# Patient Record
Sex: Male | Born: 1995 | Race: White | Hispanic: No | Marital: Single | Smoking: Former smoker
Health system: Southern US, Community
[De-identification: ages and names within clinical notes are randomized; demographics above are authoritative.]

## PROBLEM LIST (undated history)

## (undated) DIAGNOSIS — F32A Depression, unspecified: Secondary | ICD-10-CM

## (undated) DIAGNOSIS — F329 Major depressive disorder, single episode, unspecified: Secondary | ICD-10-CM

## (undated) DIAGNOSIS — F419 Anxiety disorder, unspecified: Secondary | ICD-10-CM

## (undated) DIAGNOSIS — J45909 Unspecified asthma, uncomplicated: Secondary | ICD-10-CM

---
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
## 2016-04-11 ENCOUNTER — Emergency Department
Admission: EM | Admit: 2016-04-11 | Discharge: 2016-04-11 | Disposition: A | Discharge status: Home / Self Care | Payer: Self-pay | Attending: Emergency Medicine | Admitting: Emergency Medicine

## 2016-04-11 ENCOUNTER — Encounter: Payer: Self-pay | Admitting: *Deleted

## 2016-04-11 DIAGNOSIS — R519 Headache, unspecified: Secondary | ICD-10-CM

## 2016-04-11 DIAGNOSIS — F17210 Nicotine dependence, cigarettes, uncomplicated: Secondary | ICD-10-CM | POA: Insufficient documentation

## 2016-04-11 DIAGNOSIS — R51 Headache: Secondary | ICD-10-CM | POA: Insufficient documentation

## 2016-04-11 MED ORDER — PROCHLORPERAZINE MALEATE 10 MG PO TABS: 10.0000 mg | ORAL_TABLET | Freq: Three times a day (TID) | ORAL | 0 refills | Status: AC | PRN

## 2016-04-11 MED ORDER — PROCHLORPERAZINE EDISYLATE 5 MG/ML IJ SOLN
INTRAMUSCULAR | Status: AC
  Filled 2016-04-11: qty 2

## 2016-04-11 MED ORDER — PROCHLORPERAZINE EDISYLATE 5 MG/ML IJ SOLN
10.0000 mg | Freq: Once | INTRAMUSCULAR | Status: AC
  Administered 2016-04-11: 10 mg via INTRAVENOUS
  Filled 2016-04-11: qty 2

## 2016-04-11 MED ORDER — SODIUM CHLORIDE 0.9 % IV BOLUS (SEPSIS)
1000.0000 mL | Freq: Once | INTRAVENOUS | Status: AC
  Administered 2016-04-11: 1000 mL via INTRAVENOUS

## 2016-04-11 NOTE — ED Notes (Signed)
Pt reports left sided headache pain  "i have never hurt like this - it has been hurting ever since I got up today - I ate lunch and my head has just been pounding all afternoon."   Pt crying and holding his head during assessment

## 2016-04-11 NOTE — Discharge Instructions (Signed)
Please seek medical attention for any high fevers, chest pain, shortness of breath, change in behavior, persistent vomiting, bloody stool or any other new or concerning symptoms.  

## 2016-04-11 NOTE — ED Provider Notes (Signed)
[facility] Emergency Department Provider Note   ____________________________________________   I have reviewed the triage vital signs and the nursing notes.   HISTORY  Chief Complaint Headache   History limited by: Not Limited   HPI DEJION GRILLO is a 20 y.o. male  Who presents to the emergency department today because of concerns for headache. He is located in the left side of his head. Started this morning after the patient woke up. Initially was intermittent back has become more constant. It has become severe. He has not noticed any blurred vision or double vision. He has not had any vomiting.He denies si headaches in the past. No recent illnesses or fevers. No recent trauma to the head.     History reviewed. No pertinent past medical history.  There are no active problems to display for this patient.   History reviewed. No pertinent surgical history.  Prior to Admission medications   Medication Sig Start Date End Date Taking? Authorizing Provider  cyclobenzaprine (FLEXERIL) 10 MG tablet Take 1 tablet (10 mg total) by mouth every 8 (eight) hours as needed for muscle spasms. 09/22/15   Joni Reining, PA-C  ibuprofen (ADVIL,MOTRIN) 600 MG tablet Take 1 tablet (600 mg total) by mouth every 6 (six) hours as needed. 09/22/15   Joni Reining, PA-C  traMADol (ULTRAM) 50 MG tablet Take 1 tablet (50 mg total) by mouth every 6 (six) hours as needed for moderate pain. 09/22/15   Joni Reining, PA-C    Allergies Review of patient's allergies indicates no known allergies.  History reviewed. No pertinent family history.  Social History Social History  Substance Use Topics  . Smoking status: Current Every Day Smoker    Packs/day: 0.50    Types: Cigarettes  . Smokeless tobacco: Never Used  . Alcohol use No    Review of Systems  Constitutional: Negative for fever. Cardiovascular: Negative for chest pain. Respiratory: Negative for shortness of  breath. Gastrointestinal: Negative for abdominal pain, vomiting and diarrhea. Genitourinary: Negative for dysuria. Musculoskeletal: Negative for back pain. Skin: Negative for rash. Neurological: Positive for headache.  10-point ROS otherwise negative.  ____________________________________________   PHYSICAL EXAM:  VITAL SIGNS: ED Triage Vitals  Enc Vitals Group     BP 04/11/16 1634 134/64     Pulse Rate 04/11/16 1634 71     Resp 04/11/16 1634 18     Temp 04/11/16 1634 97.5 F (36.4 C)     Temp Source 04/11/16 1634 Oral     SpO2 04/11/16 1634 100 %     Weight 04/11/16 1634 230 lb (104.3 kg)     Height 04/11/16 1634 6' (1.829 m)     Head Circumference --      Peak Flow --      Pain Score 04/11/16 1635 10   Constitutional: Alert and oriented. Well appearing and in no distress. Eyes: Conjunctivae are normal. Normal extraocular movements. ENT   Head: Normocephalic and atraumatic.   Nose: No congestion/rhinnorhea.   Mouth/Throat: Mucous membranes are moist.   Neck: No stridor. Hematological/Lymphatic/Immunilogical: No cervical lymphadenopathy. Cardiovascular: Normal rate, regular rhythm.  No murmurs, rubs, or gallops. Respiratory: Normal respiratory effort without tachypnea nor retractions. Breath sounds are clear and equal bilaterally. No wheezes/rales/rhonchi. Gastrointestinal: Soft and nontender. No distention.  Genitourinary: Deferred Musculoskeletal: Normal range of motion in all extremities. No lower extremity edema. Neurologic:  Normal speech and language. No gross focal neurologic deficits are appreciated.  Skin:  Skin is warm, dry  and intact. No rash noted. Psychiatric: Mood and affect are normal. Speech and behavior are normal. Patient exhibits appropriate insight and judgment.  ____________________________________________    LABS (pertinent  positives/negatives)  None  ____________________________________________   EKG  None  ____________________________________________    RADIOLOGY  None  ____________________________________________   PROCEDURES  Procedures  ____________________________________________   INITIAL IMPRESSION / ASSESSMENT AND PLAN / ED COURSE  Pertinent labs & imaging results that were available during my care of the patient were reviewed by me and considered in my medical decision making (see chart for details).  Patient presented to the emergency department today because of concerns for headache. No trauma. The patient was given IV fluids and stated he felt much better. Will discharge home with prescription. ____________________________________________   FINAL CLINICAL IMPRESSION(S) / ED DIAGNOSES  Final diagnoses:  Headache, unspecified headache type     Note: This dictation was prepared with Dragon dictation. Any transcriptional errors that result from this process are unintentional    Phineas SemenGraydon Lazariah Savard, MD 04/11/16 1943

## 2016-04-11 NOTE — ED Triage Notes (Signed)
States severe headache that began this AM, states he recently had a cold, pt states no relief with OTC meds, denies any hx of migranes

## 2017-03-13 ENCOUNTER — Encounter: Payer: Self-pay | Admitting: Medical Oncology

## 2017-03-13 ENCOUNTER — Emergency Department: Payer: Self-pay

## 2017-03-13 ENCOUNTER — Emergency Department
Admission: EM | Admit: 2017-03-13 | Discharge: 2017-03-13 | Disposition: A | Discharge status: Home / Self Care | Payer: Self-pay | Attending: Emergency Medicine | Admitting: Emergency Medicine

## 2017-03-13 DIAGNOSIS — F17210 Nicotine dependence, cigarettes, uncomplicated: Secondary | ICD-10-CM | POA: Insufficient documentation

## 2017-03-13 DIAGNOSIS — J45909 Unspecified asthma, uncomplicated: Secondary | ICD-10-CM | POA: Insufficient documentation

## 2017-03-13 DIAGNOSIS — Z79899 Other long term (current) drug therapy: Secondary | ICD-10-CM | POA: Insufficient documentation

## 2017-03-13 LAB — CBC
HEMATOCRIT: 44 % (ref 40.0–52.0)
HEMOGLOBIN: 14.9 g/dL (ref 13.0–18.0)
MCH: 30.3 pg (ref 26.0–34.0)
MCHC: 33.9 g/dL (ref 32.0–36.0)
MCV: 89.2 fL (ref 80.0–100.0)
Platelets: 297 10*3/uL (ref 150–440)
RBC: 4.94 MIL/uL (ref 4.40–5.90)
RDW: 13.5 % (ref 11.5–14.5)
WBC: 7 10*3/uL (ref 3.8–10.6)

## 2017-03-13 LAB — BASIC METABOLIC PANEL
Anion gap: 8 (ref 5–15)
BUN: 13 mg/dL (ref 6–20)
CHLORIDE: 103 mmol/L (ref 101–111)
CO2: 25 mmol/L (ref 22–32)
CREATININE: 0.85 mg/dL (ref 0.61–1.24)
Calcium: 9.4 mg/dL (ref 8.9–10.3)
GFR calc non Af Amer: >60 mL/min (ref >60)
GLUCOSE: 98 mg/dL (ref 65–99)
Potassium: 3.9 mmol/L (ref 3.5–5.1)
Sodium: 136 mmol/L (ref 135–145)

## 2017-03-13 MED ORDER — BUDESONIDE-FORMOTEROL FUMARATE 160-4.5 MCG/ACT IN AERO: 2.0000 | INHALATION_SPRAY | Freq: Every day | RESPIRATORY_TRACT | 0 refills | Status: AC

## 2017-03-13 MED ORDER — IPRATROPIUM-ALBUTEROL 0.5-2.5 (3) MG/3ML IN SOLN
3.0000 mL | Freq: Once | RESPIRATORY_TRACT | Status: AC
  Administered 2017-03-13: 3 mL via RESPIRATORY_TRACT
  Filled 2017-03-13: qty 3

## 2017-03-13 MED ORDER — ALBUTEROL SULFATE HFA 108 (90 BASE) MCG/ACT IN AERS: 2.0000 | INHALATION_SPRAY | Freq: Four times a day (QID) | RESPIRATORY_TRACT | 0 refills | Status: AC | PRN

## 2017-03-13 NOTE — ED Provider Notes (Signed)
[facility] Emergency Department Provider Note  ____________________________________________  Time seen: Approximately 10:30 AM  I have reviewed the triage vital signs and the nursing notes.   HISTORY  Chief Complaint Asthma    HPI Joe Sampson is a 21 y.o. male that presents to emergency department with shortness of breath for 8 months.Patient had an episode of shortness of breath this morning that lasted longer than usual. He states that episode of shortness of breath resolved when he got to the hospital. He was diagnosed with asthma as a baby. Last week shortness of breath woke him up in the middle of the night and this happened the month previously as well. He smokes a pack of cigarettes per day since he was 13. He has not had an asthmatic workup since he was young. He does not take any medications regularly for asthma. He used his mother's albuterol inhaler once this morning. He thought his asthma had resolved. He does not have the money to have a workup completed. Mother has COPD and asthma. Mother states that "not everyone smokes has lung complications." He denies fever, nasal congestion, cough, chest pain, nausea, vomiting, abdominal pain.   History reviewed. No pertinent past medical history.  There are no active problems to display for this patient.   No past surgical history on file.  Prior to Admission medications   Medication Sig Start Date End Date Taking? Authorizing Provider  albuterol (PROVENTIL HFA;VENTOLIN HFA) 108 (90 Base) MCG/ACT inhaler Inhale 2 puffs into the lungs every 6 (six) hours as needed for wheezing or shortness of breath. 03/13/17   Enid Derry, PA-C  budesonide-formoterol (SYMBICORT) 160-4.5 MCG/ACT inhaler Inhale 2 puffs into the lungs daily. 03/13/17   Enid Derry, PA-C  cyclobenzaprine (FLEXERIL) 10 MG tablet Take 1 tablet (10 mg total) by mouth every 8 (eight) hours as needed for muscle spasms. 09/22/15   Joni Reining,  PA-C  ibuprofen (ADVIL,MOTRIN) 600 MG tablet Take 1 tablet (600 mg total) by mouth every 6 (six) hours as needed. 09/22/15   Joni Reining, PA-C  prochlorperazine (COMPAZINE) 10 MG tablet Take 1 tablet (10 mg total) by mouth every 8 (eight) hours as needed (headache). 04/11/16   Phineas Semen, MD  traMADol (ULTRAM) 50 MG tablet Take 1 tablet (50 mg total) by mouth every 6 (six) hours as needed for moderate pain. 09/22/15   Joni Reining, PA-C    Allergies Patient has no known allergies.  No family history on file.  Social History Social History  Substance Use Topics  . Smoking status: Current Every Day Smoker    Packs/day: 0.50    Types: Cigarettes  . Smokeless tobacco: Never Used  . Alcohol use No     Review of Systems  Constitutional: No fever/chills Cardiovascular: No chest pain. Respiratory: No cough. Gastrointestinal: No abdominal pain.  No nausea, no vomiting.  Musculoskeletal: Negative for musculoskeletal pain. Skin: Negative for rash, abrasions, lacerations, ecchymosis. Neurological: Negative for headaches   ____________________________________________   PHYSICAL EXAM:  VITAL SIGNS: ED Triage Vitals [03/13/17 0857]  Enc Vitals Group     BP (!) 154/81     Pulse Rate 72     Resp 16     Temp 98.5 F (36.9 C)     Temp Source Oral     SpO2 100 %     Weight 240 lb (108.9 kg)     Height 5\' 10"  (1.778 m)     Head Circumference  Peak Flow      Pain Score      Pain Loc      Pain Edu?      Excl. in GC?      Constitutional: Alert and oriented. Well appearing and in no acute distress. Eyes: Conjunctivae are normal. PERRL. EOMI. Head: Atraumatic. ENT:      Ears:      Nose: No congestion/rhinnorhea.      Mouth/Throat: Mucous membranes are moist.  Neck: No stridor.  Cardiovascular: Normal rate, regular rhythm.  Good peripheral circulation. Respiratory: Normal respiratory effort without tachypnea or retractions. Lungs CTAB. Good air entry to the bases  with no decreased or absent breath sounds. Gastrointestinal: Bowel sounds 4 quadrants. Soft and nontender to palpation. Musculoskeletal: Full range of motion to all extremities. No gross deformities appreciated. Neurologic:  Normal speech and language. No gross focal neurologic deficits are appreciated.  Skin:  Skin is warm, dry and intact. No rash noted.   ____________________________________________   LABS (all labs ordered are listed, but only abnormal results are displayed)  Labs Reviewed  CBC  BASIC METABOLIC PANEL   ____________________________________________  EKG   ____________________________________________  RADIOLOGY Lexine Baton, personally viewed and evaluated these images (plain radiographs) as part of my medical decision making, as well as reviewing the written report by the radiologist.  Dg Chest 2 View  Result Date: 03/13/2017 CLINICAL DATA:  Shortness of breath.  Asthma EXAM: CHEST  2 VIEW COMPARISON:  None available FINDINGS: Normal heart size and mediastinal contours. No acute infiltrate or edema. No effusion or pneumothorax. No osseous findings. IMPRESSION: Negative chest. Electronically Signed   By: Marnee Spring M.D.   On: 03/13/2017 09:57    ____________________________________________    PROCEDURES  Procedure(s) performed:    Procedures    Medications  ipratropium-albuterol (DUONEB) 0.5-2.5 (3) MG/3ML nebulizer solution 3 mL (3 mLs Nebulization Given 03/13/17 0929)     ____________________________________________   INITIAL IMPRESSION / ASSESSMENT AND PLAN / ED COURSE  Pertinent labs & imaging results that were available during my care of the patient were reviewed by me and considered in my medical decision making (see chart for details).  Review of the [place] CSRS was performed in accordance of the [facility] prior to dispensing any controlled drugs.     Patient presented to the emergency department for shortness of breath for a month.  Symptoms are consistent with asthma. Vital signs and exam are reassuring. No acute cardiopulmonary processes on chest x-ray. Symptoms improved after DuoNeb. Patient denies any shortness of breath now. No wheezing and lungs are clear to auscultation. Education about asthma and smoking was provided. Mother and patient do not agree that smoking is affecting his lungs. They're concerned about finances and affording workup and treatment. Resources for low income treatment and [facility] were provided. Patient will be discharged home with prescriptions for albuterol and Symbicort. Patient is to follow up with PCP as directed. Patient is given ED precautions to return to the ED for any worsening or new symptoms.     ____________________________________________  FINAL CLINICAL IMPRESSION(S) / ED DIAGNOSES  Final diagnoses:  Asthma, unspecified asthma severity, unspecified whether complicated, unspecified whether persistent      NEW MEDICATIONS STARTED DURING THIS VISIT:  Discharge Medication List as of 03/13/2017 11:00 AM    START taking these medications   Details  albuterol (PROVENTIL HFA;VENTOLIN HFA) 108 (90 Base) MCG/ACT inhaler Inhale 2 puffs into the lungs every 6 (six) hours as needed for wheezing  or shortness of breath., Starting Mon 03/13/2017, Print    budesonide-formoterol (SYMBICORT) 160-4.5 MCG/ACT inhaler Inhale 2 puffs into the lungs daily., Starting Mon 03/13/2017, Print            This chart was dictated using voice recognition software/Dragon. Despite best efforts to proofread, errors can occur which can change the meaning. Any change was purely unintentional.    Enid DerryWagner, Zarie Kosiba, PA-C 03/13/17 1605    Minna AntisPaduchowski, Kevin, MD 03/15/17 2214

## 2017-03-13 NOTE — ED Triage Notes (Signed)
Pt reports asthma that flared up this am. No respiratory distress noted, no wheezing noted. Pt talking without difficulty.

## 2017-03-16 ENCOUNTER — Emergency Department
Admission: EM | Admit: 2017-03-16 | Discharge: 2017-03-16 | Disposition: A | Discharge status: Home / Self Care | Payer: Self-pay | Attending: Emergency Medicine | Admitting: Emergency Medicine

## 2017-03-16 ENCOUNTER — Emergency Department: Payer: Self-pay

## 2017-03-16 DIAGNOSIS — R202 Paresthesia of skin: Secondary | ICD-10-CM | POA: Insufficient documentation

## 2017-03-16 DIAGNOSIS — F410 Panic disorder [episodic paroxysmal anxiety] without agoraphobia: Secondary | ICD-10-CM | POA: Insufficient documentation

## 2017-03-16 DIAGNOSIS — F17200 Nicotine dependence, unspecified, uncomplicated: Secondary | ICD-10-CM | POA: Insufficient documentation

## 2017-03-16 MED ORDER — DIAZEPAM 5 MG PO TABS
10.0000 mg | ORAL_TABLET | Freq: Once | ORAL | Status: AC
  Administered 2017-03-16: 10 mg via ORAL
  Filled 2017-03-16: qty 2

## 2017-03-16 MED ORDER — LORAZEPAM 1 MG PO TABS: 1.0000 mg | ORAL_TABLET | Freq: Two times a day (BID) | ORAL | 0 refills | Status: AC

## 2017-03-16 MED ORDER — NICOTINE 21 MG/24HR TD PT24
21.0000 mg | MEDICATED_PATCH | Freq: Once | TRANSDERMAL | Status: DC
  Administered 2017-03-16: 21 mg via TRANSDERMAL
  Filled 2017-03-16: qty 1

## 2017-03-16 NOTE — ED Notes (Signed)
esig pad did not transfer

## 2017-03-16 NOTE — ED Provider Notes (Signed)
[facility] Emergency Department Provider Note       Time seen: ----------------------------------------- 10:09 PM on 03/16/2017 -----------------------------------------     I have reviewed the triage vital signs and the nursing notes.   HISTORY   Chief Complaint Shortness of Breath and Dizziness    HPI Joe Sampson is a 21 y.o. male who presents to the ED for dizziness and shortness of breath with tingling in his hands 30 minutes. Patient states he feels stressed out and feels like is going to pass out. Patient reports to recently having stopped quitting smoking as well as stop smoking marijuana. He denies fevers, chills or other complaints.   No past medical history on file.  There are no active problems to display for this patient.   No past surgical history on file.  Allergies Patient has no known allergies.  Social History Social History  Substance Use Topics  . Smoking status: Current Every Day Smoker    Packs/day: 0.50    Types: Cigarettes  . Smokeless tobacco: Never Used  . Alcohol use No    Review of Systems Constitutional: Negative for fever. Eyes: Negative for vision changes ENT:  Negative for congestion, sore throat Cardiovascular: Negative for chest pain. Respiratory:Positive for shortness of breath Gastrointestinal: Negative for abdominal pain, vomiting and diarrhea. Genitourinary: Negative for dysuria. Musculoskeletal: Negative for back pain. Skin: Negative for rash. Neurological: Negative for headaches, positive for dizziness and tingling  All systems negative/normal/unremarkable except as stated in the HPI  ____________________________________________   PHYSICAL EXAM:  VITAL SIGNS: ED Triage Vitals  Enc Vitals Group     BP 03/16/17 2109 (!) 160/80     Pulse Rate 03/16/17 2109 (!) 119     Resp 03/16/17 2109 20     Temp 03/16/17 2109 98.3 F (36.8 C)     Temp Source 03/16/17 2109 Oral     SpO2 03/16/17  2109 100 %     Weight 03/16/17 2110 240 lb (108.9 kg)     Height 03/16/17 2110 5\' 10"  (1.778 m)     Head Circumference --      Peak Flow --      Pain Score 03/16/17 2109 0     Pain Loc --      Pain Edu? --      Excl. in GC? --     Constitutional: Alert and oriented. Anxious, mild distress Eyes: Conjunctivae are normal. Normal extraocular movements. ENT   Head: Normocephalic and atraumatic.   Nose: No congestion/rhinnorhea.   Mouth/Throat: Mucous membranes are moist.   Neck: No stridor. Cardiovascular: Rapid rate, regular rhythm. No murmurs, rubs, or gallops. Respiratory: Tachypnea with clear breath sounds Gastrointestinal: Soft and nontender. Normal bowel sounds Musculoskeletal: Nontender with normal range of motion in extremities. No lower extremity tenderness nor edema. Neurologic:  Normal speech and language. No gross focal neurologic deficits are appreciated.  Skin:  Skin is warm, dry and intact. No rash noted. Psychiatric: Mood and affect are normal. Speech and behavior are normal.  ____________________________________________  EKG: Interpreted by me. Sinus rhythm with a rate of 99 bpm, sinus arrhythmia, normal PR interval, normal QRS, normal QT, normal axis.  ____________________________________________  ED COURSE:  Pertinent labs & imaging results that were available during my care of the patient were reviewed by me and considered in my medical decision making (see chart for details). Patient presents for apparent panic attack, we will assess with imaging as indicated and provided anxiolytics as well as a nicotine  patch   Procedures ____________________________________________   LABS (pertinent positives/negatives)  Labs Reviewed  BASIC METABOLIC PANEL  CBC  TROPONIN I    RADIOLOGY  Chest x-ray is normal  ____________________________________________  FINAL ASSESSMENT AND PLAN  Panic attack  Plan: Patient had presented for an anxiety attack  as well as likely nicotine withdrawal. I will place nicotine patch on him and given him oral Valium. His symptoms are improved. I will prescribe a short supply of Ativan. He is stable for outpatient follow-up.   Emily Filbert, MD   Note: This note was generated in part or whole with voice recognition software. Voice recognition is usually quite accurate but there are transcription errors that can and very often do occur. I apologize for any typographical errors that were not detected and corrected.     Emily Filbert, MD 03/16/17 2211

## 2017-03-16 NOTE — ED Notes (Signed)

## 2017-03-16 NOTE — ED Notes (Signed)
Patient states his last cigarette was around 6pm.

## 2017-03-16 NOTE — ED Notes (Signed)
Patient has albuterol inhaler at home tried that without relief. [facility] When patient was placed in room he was complaining that the blood pressure cuff was too tight and his hand was going numb. This RN explained to patient this is normal and to relax.

## 2017-03-16 NOTE — ED Triage Notes (Signed)
Pt reports dizziness, sob, tingling in hands for 30 minutes.  No chest pain.  Pt reports feeling stressed out and states I feel like i'm going to pass out.    Pt alert.

## 2017-03-16 NOTE — ED Notes (Signed)
Patient called out, went into room patient states " I feel light headed like I am going to pass out." patients mom states, "he is red and his side is hurting and he feels like he is going to pass out." patients VSS. Patient does not look to be in distress, does appear anxious.

## 2018-07-19 IMAGING — CR DG CHEST 2V
1 series · 2 of 2 positions shown · non-contrast
Comparison: Chest radiograph performed 03/13/2017

CLINICAL DATA: Acute onset of shortness of breath. Initial
encounter.

EXAM:
CHEST  2 VIEW

[Series 1: dg chest 2 view · 0.14mm/px · 2 of 2 slices shown]
[im 1/2]
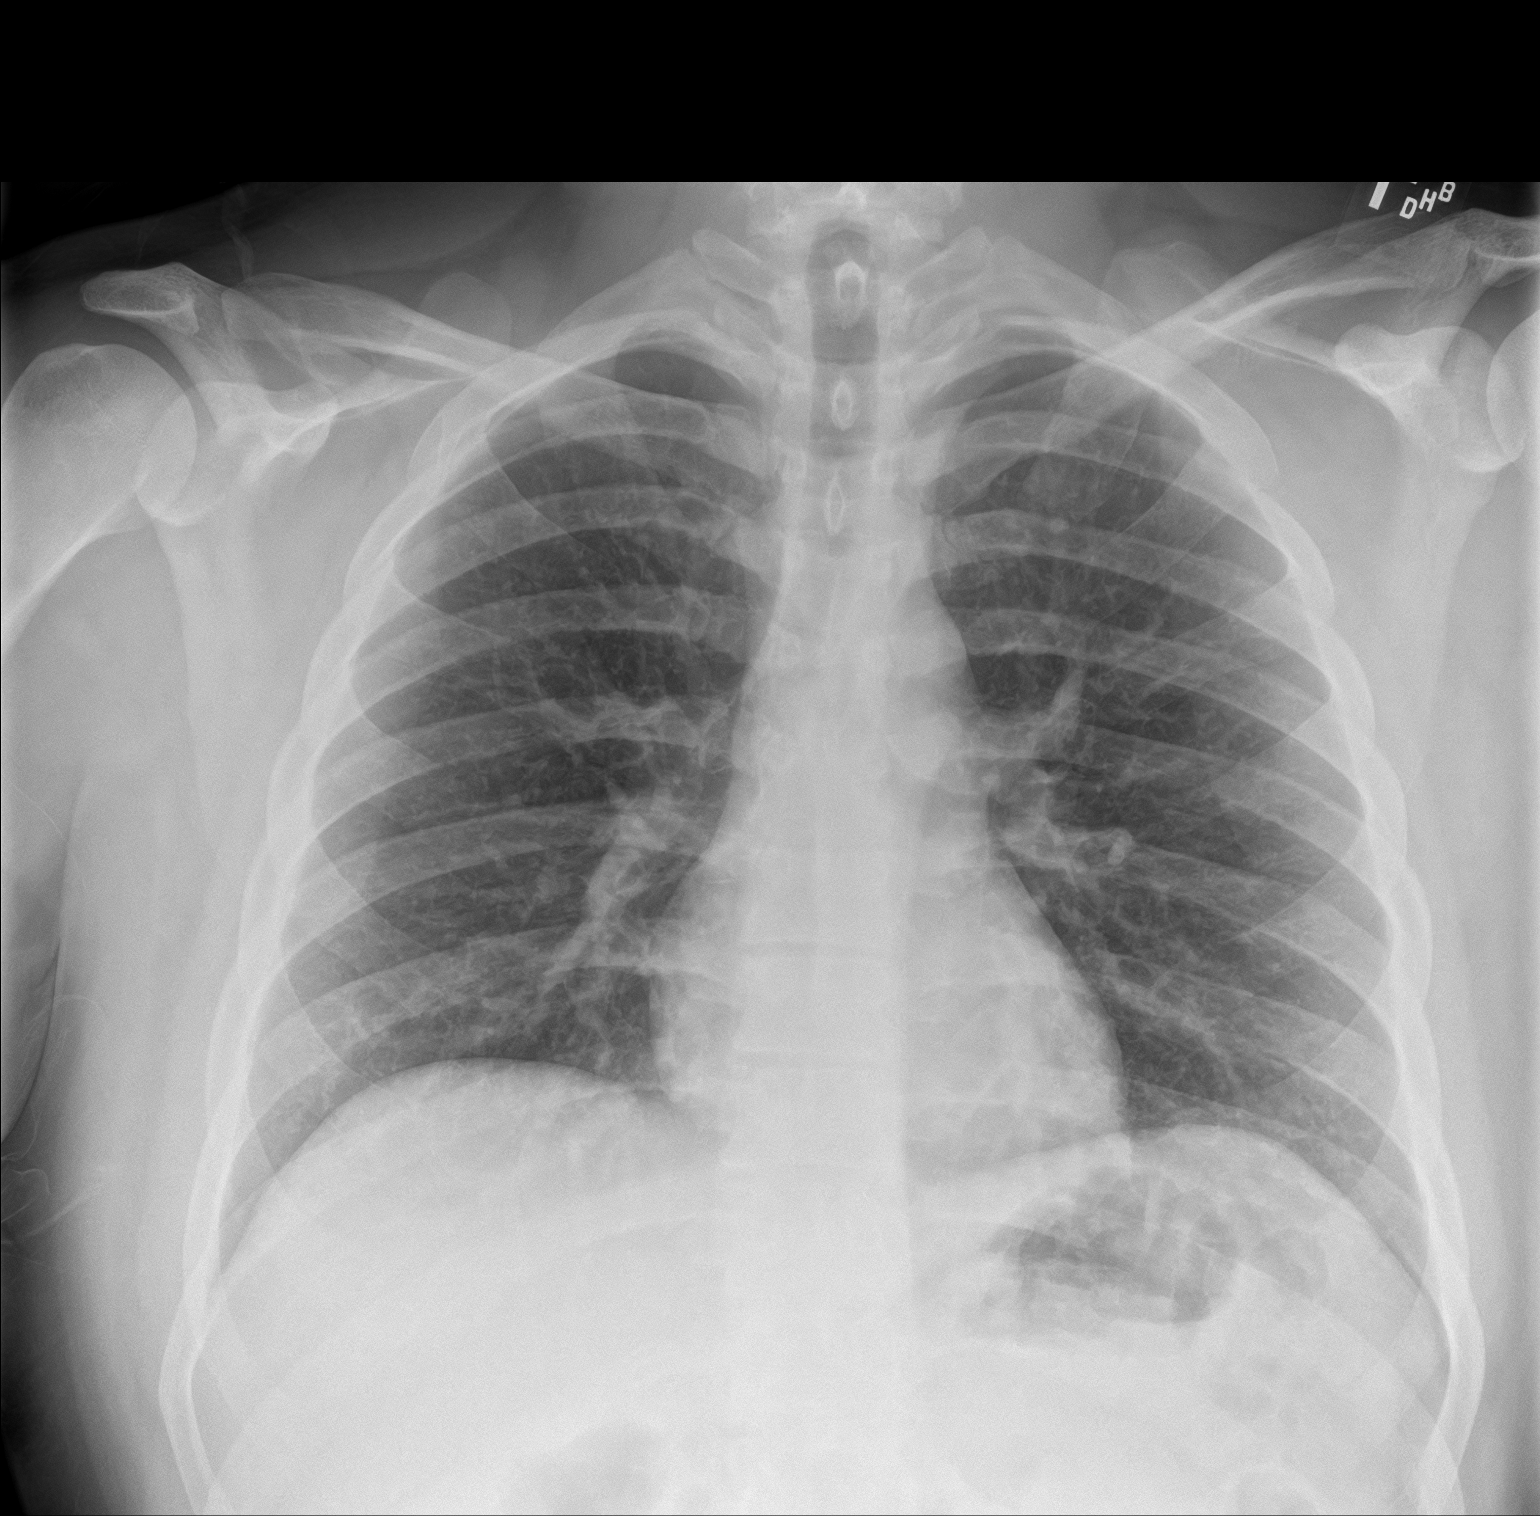
[im 2/2]
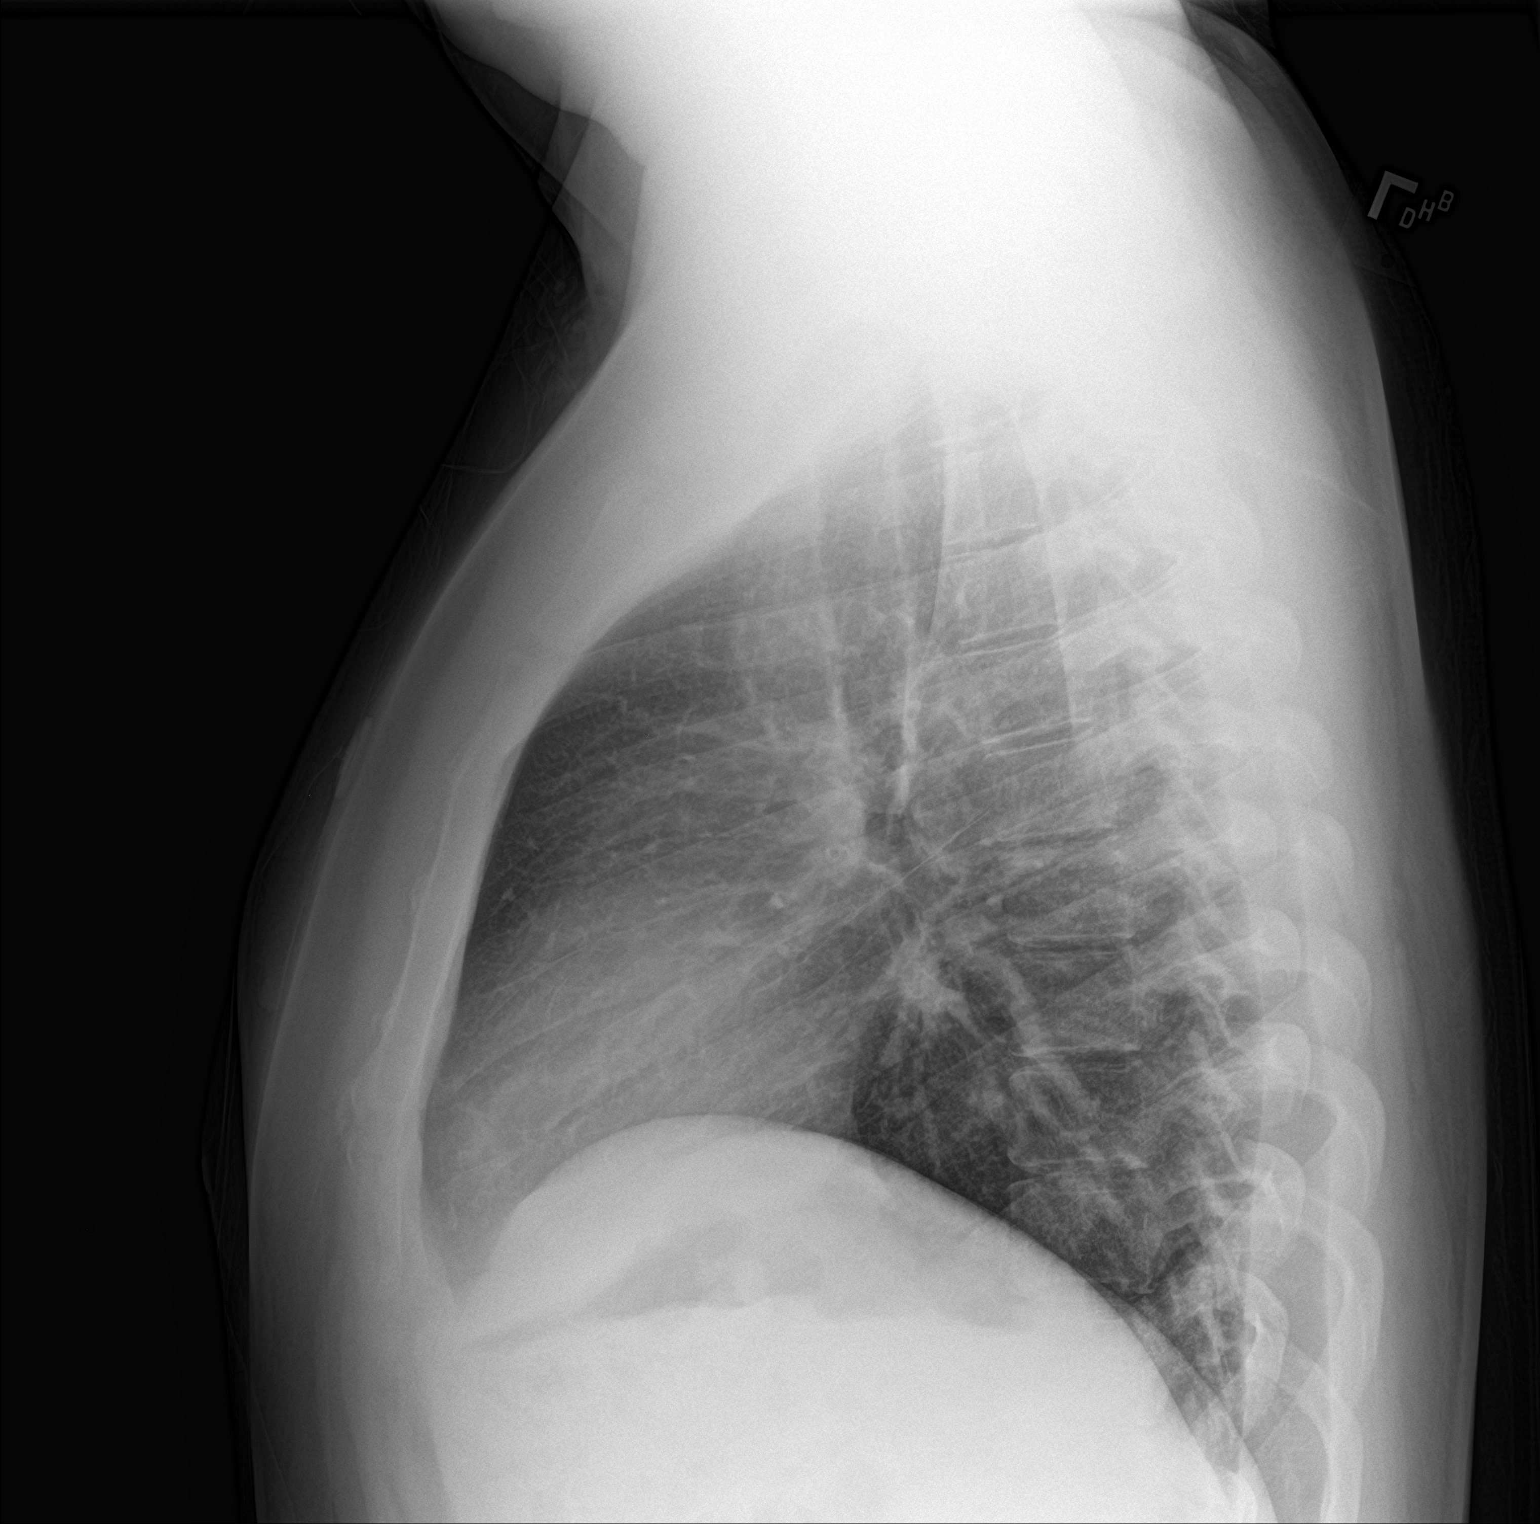

[2 of 2 positions shown; findings below may reference images not displayed]

FINDINGS: The lungs are well-aerated and clear. There is no evidence of focal
opacification, pleural effusion or pneumothorax.

The heart is normal in size; the mediastinal contour is within
normal limits. No acute osseous abnormalities are seen.
IMPRESSION: No acute cardiopulmonary process seen.

## 2018-10-27 ENCOUNTER — Other Ambulatory Visit: Payer: Self-pay

## 2018-10-27 ENCOUNTER — Emergency Department: Admission: EM | Admit: 2018-10-27 | Discharge: 2018-10-27 | Discharge status: Absent without leave | Payer: Self-pay

## 2018-10-27 ENCOUNTER — Other Ambulatory Visit
Admission: RE | Admit: 2018-10-27 | Discharge: 2018-10-27 | Disposition: A | Discharge status: Home / Self Care | Attending: Family Medicine | Admitting: Family Medicine

## 2018-10-27 NOTE — ED Notes (Signed)
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 Pt tolerated procedure well.

## 2019-03-18 ENCOUNTER — Encounter: Payer: Self-pay | Admitting: Emergency Medicine

## 2019-03-18 ENCOUNTER — Emergency Department
Admission: EM | Admit: 2019-03-18 | Discharge: 2019-03-18 | Disposition: A | Discharge status: Home / Self Care | Attending: Emergency Medicine | Admitting: Emergency Medicine

## 2019-03-18 DIAGNOSIS — S0501XA Injury of conjunctiva and corneal abrasion without foreign body, right eye, initial encounter: Secondary | ICD-10-CM | POA: Insufficient documentation

## 2019-03-18 DIAGNOSIS — Y990 Civilian activity done for income or pay: Secondary | ICD-10-CM | POA: Insufficient documentation

## 2019-03-18 DIAGNOSIS — X58XXXA Exposure to other specified factors, initial encounter: Secondary | ICD-10-CM | POA: Insufficient documentation

## 2019-03-18 DIAGNOSIS — F17210 Nicotine dependence, cigarettes, uncomplicated: Secondary | ICD-10-CM | POA: Insufficient documentation

## 2019-03-18 DIAGNOSIS — H1031 Unspecified acute conjunctivitis, right eye: Secondary | ICD-10-CM

## 2019-03-18 DIAGNOSIS — Y9389 Activity, other specified: Secondary | ICD-10-CM | POA: Insufficient documentation

## 2019-03-18 DIAGNOSIS — Y9289 Other specified places as the place of occurrence of the external cause: Secondary | ICD-10-CM | POA: Insufficient documentation

## 2019-03-18 MED ORDER — POLYMYXIN B-TRIMETHOPRIM 10000-0.1 UNIT/ML-% OP SOLN
2.0000 [drp] | Freq: Four times a day (QID) | OPHTHALMIC | Status: DC
  Administered 2019-03-18: 2 [drp] via OPHTHALMIC
  Filled 2019-03-18: qty 10

## 2019-03-18 MED ORDER — FLUORESCEIN SODIUM 1 MG OP STRP
1.0000 | ORAL_STRIP | Freq: Once | OPHTHALMIC | Status: AC
  Administered 2019-03-18: 1 via OPHTHALMIC
  Filled 2019-03-18: qty 1

## 2019-03-18 MED ORDER — POLYMYXIN B-TRIMETHOPRIM 10000-0.1 UNIT/ML-% OP SOLN: 2.0000 [drp] | Freq: Four times a day (QID) | OPHTHALMIC | 0 refills | Status: DC

## 2019-03-18 MED ORDER — KETOROLAC TROMETHAMINE 0.5 % OP SOLN: 1.0000 [drp] | Freq: Four times a day (QID) | OPHTHALMIC | 0 refills | Status: DC

## 2019-03-18 MED ORDER — TETRACAINE HCL 0.5 % OP SOLN
2.0000 [drp] | Freq: Once | OPHTHALMIC | Status: AC
  Administered 2019-03-18: 2 [drp] via OPHTHALMIC
  Filled 2019-03-18: qty 4

## 2019-03-18 NOTE — ED Triage Notes (Signed)
Pt reports getting wood shavings into the right eye x4 days ago. Pt to ED tonight due to swelling and pain. Right eye is red and swelling noted.

## 2019-03-18 NOTE — ED Provider Notes (Signed)
[facility] Emergency Department Provider Note  ____________________________________________  Time seen: Approximately 7:47 PM  I have reviewed the triage vital signs and the nursing notes.   HISTORY  Chief Complaint Eye Sampson     HPI Joe Sampson is a 23 y.o. male who presents the emergency department complaining of right eye Sampson, Joe Sampson, Joe Sampson, Joe Sampson, Joe Sampson days ago he was at work, had particle board on his arms when he went to wipe the sweat out of his eyes.  Patient reports that he believes that he scratched his eye with some of the particle board.  Since then patient has had worsening Sampson, now Joe Sampson of the conjunctival as well as Joe drainage.  No other injury or complaint.         History reviewed. No pertinent past medical history.  There are no active problems to display for this patient.   History reviewed. No pertinent surgical history.  Prior to Admission medications   Medication Sig Start Date End Date Taking? Authorizing Provider  albuterol (PROVENTIL HFA;VENTOLIN HFA) 108 (90 Base) MCG/ACT inhaler Inhale 2 puffs into the lungs every 6 (six) hours as needed for wheezing or shortness of breath. 03/13/17   Enid DerryWagner, Ashley, PA-C  budesonide-formoterol (SYMBICORT) 160-4.5 MCG/ACT inhaler Inhale 2 puffs into the lungs daily. 03/13/17   Enid DerryWagner, Ashley, PA-C  cyclobenzaprine (FLEXERIL) 10 MG tablet Take 1 tablet (10 mg total) by mouth every 8 (eight) hours as needed for muscle spasms. 09/22/15   Joni ReiningSmith, Ronald K, PA-C  ibuprofen (ADVIL,MOTRIN) 600 MG tablet Take 1 tablet (600 mg total) by mouth every 6 (six) hours as needed. 09/22/15   Joni ReiningSmith, Ronald K, PA-C  ketorolac (ACULAR) 0.5 % ophthalmic solution Place 1 drop into the right eye 4 (four) times daily. 03/18/19   Cuthriell, Delorise RoyalsJonathan D, PA-C  prochlorperazine (COMPAZINE) 10 MG tablet Take 1 tablet (10 mg total) by mouth every 8 (eight)  hours as needed (headache). 04/11/16   Phineas SemenGoodman, Graydon, MD  traMADol (ULTRAM) 50 MG tablet Take 1 tablet (50 mg total) by mouth every 6 (six) hours as needed for moderate Sampson. 09/22/15   Joni ReiningSmith, Ronald K, PA-C  trimethoprim-polymyxin b (POLYTRIM) ophthalmic solution Place 2 drops into the right eye every 6 (six) hours. 03/18/19   Cuthriell, Delorise RoyalsJonathan D, PA-C    Allergies Patient has no known allergies.  History reviewed. No pertinent family history.  Social History Social History   Tobacco Use  . Smoking status: Current Every Day Smoker    Packs/day: 0.50    Types: Cigarettes  . Smokeless tobacco: Never Used  Substance Use Topics  . Alcohol use: No  . Drug use: No     Review of Systems  Constitutional: No fever/chills Eyes: No visual changes.  Right eye irritation, Joe Sampson, Joe drainage. ENT: No upper respiratory complaints. Cardiovascular: no chest Sampson. Respiratory: no cough. No SOB. Gastrointestinal: No abdominal Sampson.  No nausea, no vomiting.   Musculoskeletal: Negative for musculoskeletal Sampson. Skin: Negative for rash, abrasions, lacerations, ecchymosis. Neurological: Negative for headaches, focal weakness or numbness. 10-point ROS otherwise negative.  ____________________________________________   PHYSICAL EXAM:  VITAL SIGNS: ED Triage Vitals [03/18/19 1919]  Enc Vitals Group     BP 129/77     Pulse Rate 85     Resp 17     Temp 99 F (37.2 C)     Temp Source Oral     SpO2 99 %  Weight      Height      Head Circumference      Peak Flow      Sampson Score      Sampson Loc      Sampson Edu?      Excl. in [place]?      Constitutional: Alert and oriented. Well appearing and in no acute distress. Eyes: Conjunctivae on right is erythematous, conjunctive on left is unremarkable.  No Joe drainage identified at this time.Marland Kitchen PERRL. EOMI. funduscopic exam reveals red reflex bilaterally.  No hyphema identified.  Vasculature and optic disc is unremarkable to the  right.  Eye is anesthetized using tetracaine drops.  Fluorescein staining applied with a few scattered superficial areas of uptake consistent with abrasion. Head: Atraumatic. ENT:      Ears:       Nose: No congestion/rhinnorhea.      Mouth/Throat: Mucous membranes are moist.  Neck: No stridor.    Cardiovascular: Normal rate, regular rhythm. Normal S1 and S2.  Good peripheral circulation. Respiratory: Normal respiratory effort without tachypnea or retractions. Lungs CTAB. Good air entry to the bases with no decreased or absent breath sounds. Musculoskeletal: Full range of motion to all extremities. No gross deformities appreciated. Neurologic:  Normal speech and language. No gross focal neurologic deficits are appreciated.  Skin:  Skin is warm, dry and intact. No rash noted. Psychiatric: Mood and affect are normal. Speech and behavior are normal. Patient exhibits appropriate insight and judgement.   ____________________________________________   LABS (all labs ordered are listed, but only abnormal results are displayed)  Labs Reviewed - No data to display ____________________________________________  EKG   ____________________________________________  RADIOLOGY   No results found.  ____________________________________________    PROCEDURES  Procedure(s) performed:    Procedures    Medications  trimethoprim-polymyxin b (POLYTRIM) ophthalmic solution 2 drop (has no administration in time range)  fluorescein ophthalmic strip 1 strip (1 strip Right Eye Given 03/18/19 1953)  tetracaine (PONTOCAINE) 0.5 % ophthalmic solution 2 drop (2 drops Right Eye Given 03/18/19 1953)     ____________________________________________   INITIAL IMPRESSION / ASSESSMENT AND PLAN / ED COURSE  Pertinent labs & imaging results that were available during my care of the patient were reviewed by me and considered in my medical decision making (see chart for details).  Review of the [place]  was performed in accordance of the [place] prior to dispensing any controlled drugs.           Patient's diagnosis is consistent with conjunctivitis, corneal abrasion.  Patient presented to the emergency department complaining of right eye irritation, drainage.  Patient reports that he wiped the sweat out of his eyes with an arm covered with particle board.  Findings on physical exam are consistent with superficial abrasions, now with superimposed bacterial conjunctivitis.  Patient will be placed on antibiotic eyedrops, Acular for symptom relief.  Patient instructed to follow-up with ophthalmology if symptoms do not improve..  Patient is given ED precautions to return to the ED for any worsening or new symptoms.     ____________________________________________  FINAL CLINICAL IMPRESSION(S) / ED DIAGNOSES  Final diagnoses:  Abrasion of right cornea, initial encounter  Acute bacterial conjunctivitis of right eye      NEW MEDICATIONS STARTED DURING THIS VISIT:  ED Discharge Orders         Ordered    trimethoprim-polymyxin b (POLYTRIM) ophthalmic solution  Every 6 hours     03/18/19 2037    ketorolac (  ACULAR) 0.5 % ophthalmic solution  4 times daily     03/18/19 2037              This chart was dictated using voice recognition software/Dragon. Despite best efforts to proofread, errors can occur which can change the meaning. Any change was purely unintentional.    Racheal PatchesCuthriell, Jonathan D, PA-C 03/18/19 2037    Arnaldo NatalMalinda, Paul F, MD 03/18/19 2238

## 2019-06-16 ENCOUNTER — Encounter: Payer: Self-pay | Admitting: Emergency Medicine

## 2019-06-16 ENCOUNTER — Other Ambulatory Visit: Payer: Self-pay

## 2019-06-16 ENCOUNTER — Emergency Department: Payer: Self-pay

## 2019-06-16 ENCOUNTER — Emergency Department
Admission: EM | Admit: 2019-06-16 | Discharge: 2019-06-17 | Disposition: A | Discharge status: Home / Self Care | Payer: Self-pay | Attending: Emergency Medicine | Admitting: Emergency Medicine

## 2019-06-16 DIAGNOSIS — Z87891 Personal history of nicotine dependence: Secondary | ICD-10-CM | POA: Insufficient documentation

## 2019-06-16 DIAGNOSIS — R000 Tachycardia, unspecified: Secondary | ICD-10-CM | POA: Insufficient documentation

## 2019-06-16 DIAGNOSIS — E860 Dehydration: Secondary | ICD-10-CM | POA: Insufficient documentation

## 2019-06-16 DIAGNOSIS — Z79899 Other long term (current) drug therapy: Secondary | ICD-10-CM | POA: Insufficient documentation

## 2019-06-16 DIAGNOSIS — J45909 Unspecified asthma, uncomplicated: Secondary | ICD-10-CM | POA: Insufficient documentation

## 2019-06-16 HISTORY — DX: Unspecified asthma, uncomplicated: J45.909

## 2019-06-16 HISTORY — DX: Depression, unspecified: F32.A

## 2019-06-16 HISTORY — DX: Anxiety disorder, unspecified: F41.9

## 2019-06-16 LAB — TSH: TSH: 1.445 u[IU]/mL (ref 0.350–4.500)

## 2019-06-16 LAB — BASIC METABOLIC PANEL
Anion gap: 14 (ref 5–15)
BUN: 13 mg/dL (ref 6–20)
CO2: 22 mmol/L (ref 22–32)
Calcium: 9.2 mg/dL (ref 8.9–10.3)
Chloride: 100 mmol/L (ref 98–111)
Creatinine, Ser: 1.37 mg/dL — ABNORMAL HIGH (ref 0.61–1.24)
GFR calc Af Amer: >60 mL/min (ref >60)
GFR calc non Af Amer: >60 mL/min (ref >60)
Glucose, Bld: 175 mg/dL — ABNORMAL HIGH (ref 70–99)
Potassium: 3.2 mmol/L — ABNORMAL LOW (ref 3.5–5.1)
Sodium: 136 mmol/L (ref 135–145)

## 2019-06-16 LAB — PHOSPHORUS: Phosphorus: 2.4 mg/dL — ABNORMAL LOW (ref 2.5–4.6)

## 2019-06-16 LAB — CBC
HCT: 42.5 % (ref 39.0–52.0)
Hemoglobin: 14.5 g/dL (ref 13.0–17.0)
MCH: 29.9 pg (ref 26.0–34.0)
MCHC: 34.1 g/dL (ref 30.0–36.0)
MCV: 87.6 fL (ref 80.0–100.0)
Platelets: 388 10*3/uL (ref 150–400)
RBC: 4.85 MIL/uL (ref 4.22–5.81)
RDW: 12.5 % (ref 11.5–15.5)
WBC: 10.2 10*3/uL (ref 4.0–10.5)
nRBC: 0 % (ref 0.0–0.2)

## 2019-06-16 LAB — URINE DRUG SCREEN, QUALITATIVE (ARMC ONLY)
Amphetamines, Ur Screen: NOT DETECTED
Barbiturates, Ur Screen: NOT DETECTED
Benzodiazepine, Ur Scrn: NOT DETECTED
Cannabinoid 50 Ng, Ur ~~LOC~~: POSITIVE — AB
Cocaine Metabolite,Ur ~~LOC~~: NOT DETECTED
MDMA (Ecstasy)Ur Screen: NOT DETECTED
Methadone Scn, Ur: NOT DETECTED
Opiate, Ur Screen: NOT DETECTED
Phencyclidine (PCP) Ur S: NOT DETECTED
Tricyclic, Ur Screen: NOT DETECTED

## 2019-06-16 LAB — TROPONIN I (HIGH SENSITIVITY): Troponin I (High Sensitivity): <2 ng/L (ref <18)

## 2019-06-16 LAB — MAGNESIUM: Magnesium: 2.1 mg/dL (ref 1.7–2.4)

## 2019-06-16 LAB — T4, FREE: Free T4: 1.04 ng/dL (ref 0.61–1.12)

## 2019-06-16 MED ORDER — PROPRANOLOL HCL 20 MG PO TABS: 20.0000 mg | ORAL_TABLET | Freq: Three times a day (TID) | ORAL | 0 refills | Status: AC

## 2019-06-16 MED ORDER — SODIUM CHLORIDE 0.9 % IV BOLUS
1000.0000 mL | Freq: Once | INTRAVENOUS | Status: AC
  Administered 2019-06-16: 1000 mL via INTRAVENOUS

## 2019-06-16 NOTE — ED Triage Notes (Signed)
Patient here with complaint of asthma exacerbation times one week. Patient states that he has used in inhaler with some improvement. Patient with clear lung sounds.

## 2019-06-16 NOTE — ED Provider Notes (Signed)
[facility] Emergency Department Provider Note  ____________________________________________  Time seen: Approximately 10:51 PM  I have reviewed the triage vital signs and the nursing notes.   HISTORY  Chief Complaint Asthma    HPI Joe Sampson is a 23 y.o. male with a history of anxiety asthma and depression who comes the ED complaining of heart racing and shortness of breath that happened earlier today.  Lasted for a few hours, he now feels better.  Denies dizziness syncope or chest pain.  Denies a history of cardiac disease or exertional symptoms.  He reports that he has been using his albuterol and decongestants and antihistamines more over the past week due to change in weather and exacerbation of his seasonal allergies.  He smokes marijuana including recently, but denies excessive alcohol use or drug or stimulant abuse.  Denies excessive caffeine use.      Past Medical History:  Diagnosis Date  . Anxiety   . Asthma   . Depression      There are no active problems to display for this patient.    History reviewed. No pertinent surgical history.   Prior to Admission medications   Medication Sig Start Date End Date Taking? Authorizing Provider  albuterol (PROVENTIL HFA;VENTOLIN HFA) 108 (90 Base) MCG/ACT inhaler Inhale 2 puffs into the lungs every 6 (six) hours as needed for wheezing or shortness of breath. 03/13/17   Laban Emperor, PA-C  budesonide-formoterol (SYMBICORT) 160-4.5 MCG/ACT inhaler Inhale 2 puffs into the lungs daily. 03/13/17   Laban Emperor, PA-C  cyclobenzaprine (FLEXERIL) 10 MG tablet Take 1 tablet (10 mg total) by mouth every 8 (eight) hours as needed for muscle spasms. 09/22/15   Sable Feil, PA-C  ibuprofen (ADVIL,MOTRIN) 600 MG tablet Take 1 tablet (600 mg total) by mouth every 6 (six) hours as needed. 09/22/15   Sable Feil, PA-C  ketorolac (ACULAR) 0.5 % ophthalmic solution Place 1 drop into the right eye 4 (four) times  daily. 03/18/19   Cuthriell, Charline Bills, PA-C  prochlorperazine (COMPAZINE) 10 MG tablet Take 1 tablet (10 mg total) by mouth every 8 (eight) hours as needed (headache). 04/11/16   Nance Pear, MD  traMADol (ULTRAM) 50 MG tablet Take 1 tablet (50 mg total) by mouth every 6 (six) hours as needed for moderate pain. 09/22/15   Sable Feil, PA-C  trimethoprim-polymyxin b (POLYTRIM) ophthalmic solution Place 2 drops into the right eye every 6 (six) hours. 03/18/19   Cuthriell, Charline Bills, PA-C     Allergies Patient has no known allergies.   No family history on file.  Social History Social History   Tobacco Use  . Smoking status: Former Smoker    Packs/day: 0.50    Types: Cigarettes  . Smokeless tobacco: Former Network engineer Use Topics  . Alcohol use: Yes    Comment: occ  . Drug use: Yes    Types: Marijuana    Review of Systems  Constitutional:   No fever or chills.  ENT:   No sore throat. No rhinorrhea. Cardiovascular:   No chest pain or syncope. Respiratory: Positive shortness of breath without cough. Gastrointestinal:   Negative for abdominal pain, vomiting and diarrhea.  Musculoskeletal:   Negative for focal pain or swelling All other systems reviewed and are negative except as documented above in ROS and HPI.  ____________________________________________   PHYSICAL EXAM:  VITAL SIGNS: ED Triage Vitals  Enc Vitals Group     BP 06/16/19 2134 (!) 138/53  Pulse Rate 06/16/19 2134 (!) 148     Resp 06/16/19 2134 20     Temp 06/16/19 2134 98.7 F (37.1 C)     Temp Source 06/16/19 2134 Oral     SpO2 06/16/19 2134 100 %     Weight 06/16/19 2135 240 lb (108.9 kg)     Height 06/16/19 2135 5\' 10"  (1.778 m)     Head Circumference --      Peak Flow --      Pain Score 06/16/19 2135 0     Pain Loc --      Pain Edu? --      Excl. in [place]? --     Vital signs reviewed, nursing assessments reviewed.   Constitutional:   Alert and oriented. Non-toxic  appearance. Eyes:   Conjunctivae are normal. EOMI. PERRL. ENT      Head:   Normocephalic and atraumatic.      Nose:   Wearing a mask.      Mouth/Throat:   Wearing a mask.      Neck:   No meningismus. Full ROM. Hematological/Lymphatic/Immunilogical:   No cervical lymphadenopathy. Cardiovascular:   Tachycardia heart rate 100. Symmetric bilateral radial and DP pulses.  No murmurs. Cap refill less than 2 seconds. Respiratory:   Normal respiratory effort without tachypnea/retractions. Breath sounds are clear and equal bilaterally. No wheezes/rales/rhonchi. Gastrointestinal:   Soft and nontender. Non distended. There is no CVA tenderness.  No rebound, rigidity, or guarding.  Musculoskeletal:   Normal range of motion in all extremities. No joint effusions.  No lower extremity tenderness.  No edema. Neurologic:   Normal speech and language.  Motor grossly intact. No acute focal neurologic deficits are appreciated.  Skin:    Skin is warm, dry and intact. No rash noted.  No petechiae, purpura, or bullae.  ____________________________________________    LABS (pertinent positives/negatives) (all labs ordered are listed, but only abnormal results are displayed) Labs Reviewed  BASIC METABOLIC PANEL - Abnormal; Notable for the following components:      Result Value   Potassium 3.2 (*)    Glucose, Bld 175 (*)    Creatinine, Ser 1.37 (*)    All other components within normal limits  PHOSPHORUS - Abnormal; Notable for the following components:   Phosphorus 2.4 (*)    All other components within normal limits  URINE DRUG SCREEN, QUALITATIVE ([facility] ONLY) - Abnormal; Notable for the following components:   Cannabinoid 50 Ng, Ur [place] POSITIVE (*)    All other components within normal limits  CBC  T4, FREE  TSH  MAGNESIUM  FIBRIN DERIVATIVES D-DIMER ([facility] ONLY)  TROPONIN I (HIGH SENSITIVITY)  TROPONIN I (HIGH SENSITIVITY)   ____________________________________________   EKG  Interpreted by  me Sinus tachycardia rate 133.  Normal axis and intervals.  Normal QRS ST segments and T waves.  Compared to previous EKG August 2018, there is a new S1Q3T3 pattern.  ____________________________________________    RADIOLOGY  Dg Chest 2 View  Result Date: 06/16/2019 CLINICAL DATA:  Asthma exacerbation EXAM: CHEST - 2 VIEW COMPARISON:  03/16/2017 FINDINGS: The heart size and mediastinal contours are within normal limits. Both lungs are clear. The visualized skeletal structures are unremarkable. IMPRESSION: No acute abnormality of the lungs. Electronically Signed   By: 05/16/2017 M.D.   On: 06/16/2019 21:58    ____________________________________________   PROCEDURES Procedures  ____________________________________________  DIFFERENTIAL DIAGNOSIS   Anxiety, dehydration, pulmonary embolism  CLINICAL IMPRESSION / ASSESSMENT AND PLAN / ED COURSE  Medications ordered in the ED: Medications  sodium chloride 0.9 % bolus 1,000 mL (0 mLs Intravenous Stopped 06/16/19 2343)    Pertinent labs & imaging results that were available during my care of the patient were reviewed by me and considered in my medical decision making (see chart for details).  Lum Babeatrick J Dewald was evaluated in Emergency Department on 06/16/2019 for the symptoms described in the history of present illness. He was evaluated in the context of the global COVID-19 pandemic, which necessitated consideration that the patient might be at risk for infection with the SARS-CoV-2 virus that causes COVID-19. Institutional protocols and algorithms that pertain to the evaluation of patients at risk for COVID-19 are in a state of rapid change based on information released by regulatory bodies including the CDC and federal and state organizations. These policies and algorithms were followed during the patient's care in the ED.   Patient presents with shortness of breath and tachycardia, anxiousness.  He is now starting to feel better.  We  will give IV fluids and check a lab panel.  Chest x-ray is unremarkable.  Clinical Course as of Jun 15 2344  Wynelle LinkSun Jun 16, 2019  2250 Labs are all unremarkable except for creatinine of 1.4 compared to a baseline of 0.8 consistent with dehydration as a cause for tachycardia.  Patient appears calm, heart rate is improved to 90.  We will need to do a D-dimer for further risk stratification.   [PS]    Clinical Course User Index [PS] Sharman CheekStafford, Jasiri Hanawalt, MD     ____________________________________________   FINAL CLINICAL IMPRESSION(S) / ED DIAGNOSES    Final diagnoses:  Sinus tachycardia  Dehydration     ED Discharge Orders    None      Portions of this note were generated with dragon dictation software. Dictation errors may occur despite best attempts at proofreading.   Sharman CheekStafford, Ana Woodroof, MD 06/16/19 [phone]

## 2019-06-16 NOTE — ED Notes (Signed)
While talking to patient about why he is here, pt was having a brief period where he was not able to comprehend what I was asking him, pt became pale and diaphoretic. Pt states he has had periods off and on today when his heart is racing.

## 2019-06-16 NOTE — ED Provider Notes (Signed)
-----------------------------------------   11:07 PM on 06/16/2019 -----------------------------------------  Assuming care from Dr. Joni Fears.  In short, Joe Sampson is a 23 y.o. male with a chief complaint of shortness of breath.  Refer to the original H&P for additional details.  The current plan of care is to follow up d-dimer.  If elevated, patient should get a CTA chest to rule out PE given his tachycardia, dyspnea, and some non-specific EKG changes.  If d-dimer WNL, since patient is feeling better clinicially, he may  be discharged for outpatient follow up.    ----------------------------------------- 12:50 AM on 06/17/2019 -----------------------------------------  D-dimer is within normal limits.  Will be discharged as per Dr. Jerene Canny instructions.   Hinda Kehr, MD 06/17/19 [phone]

## 2019-06-17 LAB — FIBRIN DERIVATIVES D-DIMER (ARMC ONLY): Fibrin derivatives D-dimer (ARMC): 216.84 ng/mL (FEU) (ref 0.00–499.00)

## 2019-06-17 LAB — TROPONIN I (HIGH SENSITIVITY): Troponin I (High Sensitivity): <2 ng/L (ref <18)

## 2019-12-01 ENCOUNTER — Emergency Department
Admission: EM | Admit: 2019-12-01 | Discharge: 2019-12-01 | Disposition: A | Discharge status: Home / Self Care | Payer: Self-pay | Attending: Emergency Medicine | Admitting: Emergency Medicine

## 2019-12-01 ENCOUNTER — Emergency Department: Payer: Self-pay

## 2019-12-01 ENCOUNTER — Other Ambulatory Visit: Payer: Self-pay

## 2019-12-01 DIAGNOSIS — J45909 Unspecified asthma, uncomplicated: Secondary | ICD-10-CM | POA: Insufficient documentation

## 2019-12-01 DIAGNOSIS — Z87891 Personal history of nicotine dependence: Secondary | ICD-10-CM | POA: Insufficient documentation

## 2019-12-01 DIAGNOSIS — J209 Acute bronchitis, unspecified: Secondary | ICD-10-CM | POA: Insufficient documentation

## 2019-12-01 DIAGNOSIS — R0789 Other chest pain: Secondary | ICD-10-CM

## 2019-12-01 DIAGNOSIS — Z79899 Other long term (current) drug therapy: Secondary | ICD-10-CM | POA: Insufficient documentation

## 2019-12-01 LAB — CBC
HCT: 43.3 % (ref 39.0–52.0)
Hemoglobin: 14.6 g/dL (ref 13.0–17.0)
MCH: 29.7 pg (ref 26.0–34.0)
MCHC: 33.7 g/dL (ref 30.0–36.0)
MCV: 88.2 fL (ref 80.0–100.0)
Platelets: 316 10*3/uL (ref 150–400)
RBC: 4.91 MIL/uL (ref 4.22–5.81)
RDW: 12.7 % (ref 11.5–15.5)
WBC: 7.7 10*3/uL (ref 4.0–10.5)
nRBC: 0 % (ref 0.0–0.2)

## 2019-12-01 LAB — BASIC METABOLIC PANEL
Anion gap: 7 (ref 5–15)
BUN: 14 mg/dL (ref 6–20)
CO2: 26 mmol/L (ref 22–32)
Calcium: 8.8 mg/dL — ABNORMAL LOW (ref 8.9–10.3)
Chloride: 101 mmol/L (ref 98–111)
Creatinine, Ser: 1 mg/dL (ref 0.61–1.24)
GFR calc Af Amer: >60 mL/min (ref >60)
GFR calc non Af Amer: >60 mL/min (ref >60)
Glucose, Bld: 109 mg/dL — ABNORMAL HIGH (ref 70–99)
Potassium: 4.2 mmol/L (ref 3.5–5.1)
Sodium: 134 mmol/L — ABNORMAL LOW (ref 135–145)

## 2019-12-01 LAB — TROPONIN I (HIGH SENSITIVITY): Troponin I (High Sensitivity): <2 ng/L (ref <18)

## 2019-12-01 MED ORDER — AZITHROMYCIN 250 MG PO TABS: ORAL_TABLET | ORAL | 0 refills | Status: AC

## 2019-12-01 NOTE — ED Triage Notes (Signed)
Patient reports chest pain off/on since Thursday, worse this morning with waking.  Patient reports pain worse with deep breathing and coughing.

## 2019-12-01 NOTE — Discharge Instructions (Signed)
Take the antibiotic as prescribed.  You may take 600 mg of ibuprofen (Advil, Motrin) every 6 hours as needed for pain.  Return to the ER for new, worsening, or persistent severe chest pain, difficulty breathing, weakness, or any other new or worsening symptoms are concerning.

## 2019-12-01 NOTE — ED Provider Notes (Signed)
[facility] Emergency Department Provider Note ____________________________________________   First MD Initiated Contact with Patient 12/01/19 [phone]     (approximate)  I have reviewed the triage vital signs and the nursing notes.   HISTORY  Chief Complaint Chest Pain    HPI Joe Sampson is a 24 y.o. male with PMH as noted below who presents with chest pain, substernal and somewhat left-sided, described mainly as sharp, and worse with deep inspiration.  He states it has been there constantly for the last 3 days.  He states is worse with certain positions, especially turning to the left.  He reports some cough productive of yellow sputum over the same time as well as some nasal congestion.  He denies shortness of breath, fever, or weakness.  He is finishing an antibiotic course for infection in one of his fingers.  Past Medical History:  Diagnosis Date  . Anxiety   . Asthma   . Depression     There are no problems to display for this patient.   No past surgical history on file.  Prior to Admission medications   Medication Sig Start Date End Date Taking? Authorizing Provider  albuterol (PROVENTIL HFA;VENTOLIN HFA) 108 (90 Base) MCG/ACT inhaler Inhale 2 puffs into the lungs every 6 (six) hours as needed for wheezing or shortness of breath. 03/13/17   Enid Derry, PA-C  azithromycin (ZITHROMAX Z-PAK) 250 MG tablet Take 2 tablets (500 mg) on  Day 1,  followed by 1 tablet (250 mg) once daily on Days 2 through 5. 12/01/19 12/06/19  Dionne Bucy, MD  budesonide-formoterol (SYMBICORT) 160-4.5 MCG/ACT inhaler Inhale 2 puffs into the lungs daily. 03/13/17   Enid Derry, PA-C  cyclobenzaprine (FLEXERIL) 10 MG tablet Take 1 tablet (10 mg total) by mouth every 8 (eight) hours as needed for muscle spasms. 09/22/15   Joni Reining, PA-C  ibuprofen (ADVIL,MOTRIN) 600 MG tablet Take 1 tablet (600 mg total) by mouth every 6 (six) hours as needed. 09/22/15   Joni Reining, PA-C  ketorolac (ACULAR) 0.5 % ophthalmic solution Place 1 drop into the right eye 4 (four) times daily. 03/18/19   Cuthriell, Delorise Royals, PA-C  prochlorperazine (COMPAZINE) 10 MG tablet Take 1 tablet (10 mg total) by mouth every 8 (eight) hours as needed (headache). 04/11/16   Phineas Semen, MD  propranolol (INDERAL) 20 MG tablet Take 1 tablet (20 mg total) by mouth 3 (three) times daily. 06/16/19   Sharman Cheek, MD  traMADol (ULTRAM) 50 MG tablet Take 1 tablet (50 mg total) by mouth every 6 (six) hours as needed for moderate pain. 09/22/15   Joni Reining, PA-C  trimethoprim-polymyxin b (POLYTRIM) ophthalmic solution Place 2 drops into the right eye every 6 (six) hours. 03/18/19   Cuthriell, Delorise Royals, PA-C    Allergies Patient has no known allergies.  No family history on file.  Social History Social History   Tobacco Use  . Smoking status: Former Smoker    Packs/day: 0.50    Types: Cigarettes  . Smokeless tobacco: Former Engineer, water Use Topics  . Alcohol use: Yes    Comment: occ  . Drug use: Yes    Types: Marijuana    Review of Systems  Constitutional: No fever/chills Eyes: No visual changes. ENT: No sore throat. Cardiovascular: Positive for chest pain. Respiratory: Denies shortness of breath. Gastrointestinal: No vomiting or diarrhea.  Genitourinary: Negative for flank pain. Musculoskeletal: Negative for back pain. Skin: Negative for rash. Neurological: Negative  for headache.   ____________________________________________   PHYSICAL EXAM:  VITAL SIGNS: ED Triage Vitals [12/01/19 0656]  Enc Vitals Group     BP      Pulse      Resp      Temp      Temp src      SpO2      Weight 250 lb (113.4 kg)     Height 5\' 10"  (1.778 m)     Head Circumference      Peak Flow      Pain Score 7     Pain Loc      Pain Edu?      Excl. in [place]?     Constitutional: Alert and oriented. Well appearing and in no acute distress. Eyes: Conjunctivae are  normal.  Head: Atraumatic. Nose: No congestion/rhinnorhea. Mouth/Throat: Mucous membranes are moist.   Neck: Normal range of motion.  Cardiovascular: Normal rate, regular rhythm. Grossly normal heart sounds.  Good peripheral circulation. Respiratory: Normal respiratory effort.  No retractions. Lungs CTAB. Gastrointestinal: No distention.  Musculoskeletal: No lower extremity edema.  No calf or popliteal swelling or tenderness.  Extremities warm and well perfused.  Neurologic:  Normal speech and language. No gross focal neurologic deficits are appreciated.  Skin:  Skin is warm and dry. No rash noted. Psychiatric: Mood and affect are normal. Speech and behavior are normal.  ____________________________________________   LABS (all labs ordered are listed, but only abnormal results are displayed)  Labs Reviewed  BASIC METABOLIC PANEL - Abnormal; Notable for the following components:      Result Value   Sodium 134 (*)    Glucose, Bld 109 (*)    Calcium 8.8 (*)    All other components within normal limits  CBC  TROPONIN I (HIGH SENSITIVITY)  TROPONIN I (HIGH SENSITIVITY)   ____________________________________________  EKG  ED ECG REPORT I, Arta Silence, the attending physician, personally viewed and interpreted this ECG.  Date: 12/01/2019 EKG Time: 0654 Rate: 89 Rhythm: normal sinus rhythm QRS Axis: normal Intervals: normal ST/T Wave abnormalities: normal Narrative Interpretation: no evidence of acute ischemia  ____________________________________________  RADIOLOGY  CXR: No focal infiltrate or other acute abnormality  ____________________________________________   PROCEDURES  Procedure(s) performed: No  Procedures  Critical Care performed: No ____________________________________________   INITIAL IMPRESSION / ASSESSMENT AND PLAN / ED COURSE  Pertinent labs & imaging results that were available during my care of the patient were reviewed by me and  considered in my medical decision making (see chart for details).  24 year old male with PMH as noted above presents with atypical chest pain over the last 3 days.  He reports some associated productive cough and upper respiratory congestion.  I reviewed the past medical records in [place].  The patient was seen in the ED in November of last year with shortness of breath and palpitations, and was found to be tachycardic.  He was ruled out for ACS and had a negative D-dimer.  On exam, he is overall very well-appearing.  His vital signs are normal.  The physical exam is otherwise unremarkable.  Lungs are clear bilaterally.  The patient's EKG shows no ischemic findings.  Overall presentation is consistent with acute bronchitis versus upper respiratory infection, with likely musculoskeletal component of the chest pain.  Given the patient's overall very low risk for ACS and the duration of the symptoms, he can safely be ruled out with a single troponin.  We will obtain a chest x-ray and basic labs  as well.  The patient has no clinical evidence of DVT or PE, and can be ruled out by [facility]  ----------------------------------------- 8:40 AM on 12/01/2019 -----------------------------------------  Chest x-ray shows no focal infiltrate.  Lab work-up including troponin is negative.  At this time, the patient is stable for discharge home.  Given the productive cough and the overall description of the symptoms, acute bronchitis is likely.  Based on discussion with the patient we will give a Z-Pak.  I instructed him to use ibuprofen for pain.  Return precautions given, and he expresses understanding.  ____________________________________________   FINAL CLINICAL IMPRESSION(S) / ED DIAGNOSES  Final diagnoses:  Atypical chest pain  Acute bronchitis, unspecified organism      NEW MEDICATIONS STARTED DURING THIS VISIT:  New Prescriptions   AZITHROMYCIN (ZITHROMAX Z-PAK) 250 MG TABLET    Take 2 tablets  (500 mg) on  Day 1,  followed by 1 tablet (250 mg) once daily on Days 2 through 5.     Note:  This document was prepared using Dragon voice recognition software and may include unintentional dictation errors.   Dionne Bucy, MD 12/01/19 [phone]

## 2020-04-02 ENCOUNTER — Emergency Department
Admission: EM | Admit: 2020-04-02 | Discharge: 2020-04-02 | Disposition: A | Discharge status: Home / Self Care | Payer: HRSA Program | Attending: Emergency Medicine | Admitting: Emergency Medicine

## 2020-04-02 ENCOUNTER — Other Ambulatory Visit: Payer: Self-pay

## 2020-04-02 DIAGNOSIS — Z79899 Other long term (current) drug therapy: Secondary | ICD-10-CM | POA: Insufficient documentation

## 2020-04-02 DIAGNOSIS — R519 Headache, unspecified: Secondary | ICD-10-CM | POA: Diagnosis present

## 2020-04-02 DIAGNOSIS — U071 COVID-19: Secondary | ICD-10-CM | POA: Insufficient documentation

## 2020-04-02 DIAGNOSIS — F1590 Other stimulant use, unspecified, uncomplicated: Secondary | ICD-10-CM | POA: Insufficient documentation

## 2020-04-02 DIAGNOSIS — R432 Parageusia: Secondary | ICD-10-CM

## 2020-04-02 DIAGNOSIS — J45909 Unspecified asthma, uncomplicated: Secondary | ICD-10-CM | POA: Insufficient documentation

## 2020-04-02 DIAGNOSIS — Z87891 Personal history of nicotine dependence: Secondary | ICD-10-CM | POA: Diagnosis not present

## 2020-04-02 LAB — SARS CORONAVIRUS 2 BY RT PCR (HOSPITAL ORDER, PERFORMED IN ~~LOC~~ HOSPITAL LAB): SARS Coronavirus 2: POSITIVE — AB

## 2020-04-02 NOTE — ED Triage Notes (Signed)
Pt states he has been having headaches the past couple days and last night while eating dinner realized he could not taste it- pt has been taking tylenol for the headaches- pt also states that his sinuses have been stuffy

## 2020-04-02 NOTE — ED Provider Notes (Signed)
Emergency Department Provider Note  ____________________________________________  Time seen: Approximately 11:11 PM  I have reviewed the triage vital signs and the nursing notes.   HISTORY  Chief Complaint Headache   Historian Patient    HPI Joe Sampson is a 24 y.o. male presents to the emergency department with headache and loss of taste and smell.  Patient is presenting to the emergency department for COVID-19 testing.  He denies fever and chills.  No chest pain, chest tightness or abdominal pain.   Past Medical History:  Diagnosis Date  . Anxiety   . Asthma   . Depression      Immunizations up to date:  Yes.     Past Medical History:  Diagnosis Date  . Anxiety   . Asthma   . Depression     There are no problems to display for this patient.   History reviewed. No pertinent surgical history.  Prior to Admission medications   Medication Sig Start Date End Date Taking? Authorizing Provider  albuterol (PROVENTIL HFA;VENTOLIN HFA) 108 (90 Base) MCG/ACT inhaler Inhale 2 puffs into the lungs every 6 (six) hours as needed for wheezing or shortness of breath. 03/13/17   Enid Derry, PA-C  budesonide-formoterol (SYMBICORT) 160-4.5 MCG/ACT inhaler Inhale 2 puffs into the lungs daily. 03/13/17   Enid Derry, PA-C  cyclobenzaprine (FLEXERIL) 10 MG tablet Take 1 tablet (10 mg total) by mouth every 8 (eight) hours as needed for muscle spasms. 09/22/15   Joni Reining, PA-C  ibuprofen (ADVIL,MOTRIN) 600 MG tablet Take 1 tablet (600 mg total) by mouth every 6 (six) hours as needed. 09/22/15   Joni Reining, PA-C  ketorolac (ACULAR) 0.5 % ophthalmic solution Place 1 drop into the right eye 4 (four) times daily. 03/18/19   Cuthriell, Delorise Royals, PA-C  prochlorperazine (COMPAZINE) 10 MG tablet Take 1 tablet (10 mg total) by mouth every 8 (eight) hours as needed (headache). 04/11/16   Phineas Semen, MD  propranolol (INDERAL) 20 MG tablet Take 1 tablet (20 mg total) by  mouth 3 (three) times daily. 06/16/19   Sharman Cheek, MD  traMADol (ULTRAM) 50 MG tablet Take 1 tablet (50 mg total) by mouth every 6 (six) hours as needed for moderate pain. 09/22/15   Joni Reining, PA-C  trimethoprim-polymyxin b (POLYTRIM) ophthalmic solution Place 2 drops into the right eye every 6 (six) hours. 03/18/19   Cuthriell, Delorise Royals, PA-C    Allergies Patient has no known allergies.  No family history on file.  Social History Social History   Tobacco Use  . Smoking status: Former Smoker    Packs/day: 0.50    Types: Cigarettes  . Smokeless tobacco: Former Engineer, water Use Topics  . Alcohol use: Yes    Comment: occ  . Drug use: Yes    Types: Marijuana     Review of Systems  Constitutional: No fever/chills Eyes:  No discharge ENT: No upper respiratory complaints. Respiratory: no cough. No SOB/ use of accessory muscles to breath Gastrointestinal:   No nausea, no vomiting.  No diarrhea.  No constipation. Musculoskeletal: Negative for musculoskeletal pain. Skin: Negative for rash, abrasions, lacerations, ecchymosis.   ____________________________________________   PHYSICAL EXAM:  VITAL SIGNS: ED Triage Vitals  Enc Vitals Group     BP 04/02/20 1849 (!) 149/81     Pulse Rate 04/02/20 1849 94     Resp 04/02/20 1849 20     Temp 04/02/20 1849 99.1 F (37.3 C)     Temp  Source 04/02/20 1849 Oral     SpO2 04/02/20 1849 100 %     Weight 04/02/20 1849 260 lb (117.9 kg)     Height 04/02/20 1849 5\' 10"  (1.778 m)     Head Circumference --      Peak Flow --      Pain Score 04/02/20 1848 5     Pain Loc --      Pain Edu? --      Excl. in [place]? --      Constitutional: Alert and oriented. Patient is lying supine. Eyes: Conjunctivae are normal. PERRL. EOMI. Head: Atraumatic. ENT:      Ears: Tympanic membranes are mildly injected with mild effusion bilaterally.       Nose: No congestion/rhinnorhea.      Mouth/Throat: Mucous membranes are moist. Posterior  pharynx is mildly erythematous.  Hematological/Lymphatic/Immunilogical: No cervical lymphadenopathy.  Cardiovascular: Normal rate, regular rhythm. Normal S1 and S2.  Good peripheral circulation. Respiratory: Normal respiratory effort without tachypnea or retractions. Lungs CTAB. Good air entry to the bases with no decreased or absent breath sounds. Gastrointestinal: Bowel sounds 4 quadrants. Soft and nontender to palpation. No guarding or rigidity. No palpable masses. No distention. No CVA tenderness. Musculoskeletal: Full range of motion to all extremities. No gross deformities appreciated. Neurologic:  Normal speech and language. No gross focal neurologic deficits are appreciated.  Skin:  Skin is warm, dry and intact. No rash noted. Psychiatric: Mood and affect are normal. Speech and behavior are normal. Patient exhibits appropriate insight and judgement.    ____________________________________________   LABS (all labs ordered are listed, but only abnormal results are displayed)  Labs Reviewed  SARS CORONAVIRUS 2 BY RT PCR (HOSPITAL ORDER, PERFORMED IN [facility])   ____________________________________________  EKG   ____________________________________________  RADIOLOGY   No results found.  ____________________________________________    PROCEDURES  Procedure(s) performed:     Procedures     Medications - No data to display   ____________________________________________   INITIAL IMPRESSION / ASSESSMENT AND PLAN / ED COURSE  Pertinent labs & imaging results that were available during my care of the patient were reviewed by me and considered in my medical decision making (see chart for details).      Assessment and Plan: Headache Loss of taste and smell 25 year old male presents to the emergency department with headache and loss of taste and smell that started last night.  Vital signs were reassuring at triage.  On physical exam,  patient had no increased work of breathing was resting comfortably.  Neuro exam was appropriate within reference range.    COVID-19 testing is in progress at this time.  Return precautions were given to return with new or worsening symptoms.  Tylenol and ibuprofen alternating were recommended for headache.  ____________________________________________  FINAL CLINICAL IMPRESSION(S) / ED DIAGNOSES  Final diagnoses:  Acute nonintractable headache, unspecified headache type  Loss of taste      NEW MEDICATIONS STARTED DURING THIS VISIT:  ED Discharge Orders    None          This chart was dictated using voice recognition software/Dragon. Despite best efforts to proofread, errors can occur which can change the meaning. Any change was purely unintentional.     30, PA-C 04/02/20 2314    04/04/20, MD 04/07/20 [phone]

## 2020-04-02 NOTE — ED Notes (Signed)
Pt states coming in because he has a headache and loss of taste and swell. Pt states symptoms started yesterday.

## 2020-04-04 ENCOUNTER — Telehealth: Payer: Self-pay | Admitting: Infectious Diseases

## 2020-04-04 ENCOUNTER — Other Ambulatory Visit: Payer: Self-pay | Admitting: Infectious Diseases

## 2020-04-04 DIAGNOSIS — U071 COVID-19: Secondary | ICD-10-CM

## 2020-04-04 DIAGNOSIS — Z6837 Body mass index (BMI) 37.0-37.9, adult: Secondary | ICD-10-CM

## 2020-04-04 NOTE — Progress Notes (Signed)
I connected by phone with Joe Sampson on 04/04/2020 at 2:49 PM to discuss the potential use of a new treatment for mild to moderate COVID-19 viral infection in non-hospitalized patients.  This patient is a 24 y.o. male that meets the FDA criteria for Emergency Use Authorization of COVID monoclonal antibody casirivimab/imdevimab.  Has a (+) direct SARS-CoV-2 viral test result  Has mild or moderate COVID-19   Is NOT hospitalized due to COVID-19  Is within 10 days of symptom onset  Has at least one of the high risk factor(s) for progression to severe COVID-19 and/or hospitalization as defined in EUA.  Specific high risk criteria : BMI > 25   I have spoken and communicated the following to the patient or parent/caregiver regarding COVID monoclonal antibody treatment:  1. FDA has authorized the emergency use for the treatment of mild to moderate COVID-19 in adults and pediatric patients with positive results of direct SARS-CoV-2 viral testing who are 68 years of age and older weighing at least 40 kg, and who are at high risk for progressing to severe COVID-19 and/or hospitalization.  2. The significant known and potential risks and benefits of COVID monoclonal antibody, and the extent to which such potential risks and benefits are unknown.  3. Information on available alternative treatments and the risks and benefits of those alternatives, including clinical trials.  4. Patients treated with COVID monoclonal antibody should continue to self-isolate and use infection control measures (e.g., wear mask, isolate, social distance, avoid sharing personal items, clean and disinfect "high touch" surfaces, and frequent handwashing) according to CDC guidelines.   5. The patient or parent/caregiver has the option to accept or refuse COVID monoclonal antibody treatment.  After reviewing this information with the patient, The patient agreed to proceed with receiving casirivimab\imdevimab infusion and  will be provided a copy of the Fact sheet prior to receiving the infusion. Joe Sampson 04/04/2020 2:49 PM

## 2020-04-04 NOTE — Telephone Encounter (Signed)
Called to Discuss with patient about Covid symptoms and the use of the monoclonal antibody infusion for those with mild to moderate Covid symptoms and at a high risk of hospitalization.    Pt appears to qualify for this infusion due to co-morbid conditions and/or a member of an at-risk group in accordance with the FDA Emergency Use Authorization. (BMI > 35 and Asthma)    He has coughing and nasal congestion and headaches.  Sx 8/25 noticed no smell/taste.   He lives with his mother who has COPD and getting tested today.   He would like to look over information and contact me back if he would like to be scheduled.    Rexene Alberts, MSN, NP-C [facility]  [email or web address] Pager: [phone] Office: [phone] [facility] Main Line: [phone]

## 2020-04-05 ENCOUNTER — Ambulatory Visit (HOSPITAL_COMMUNITY)
Admission: RE | Admit: 2020-04-05 | Discharge: 2020-04-05 | Disposition: A | Discharge status: Home / Self Care | Payer: HRSA Program | Source: Ambulatory Visit | Attending: Pulmonary Disease | Admitting: Pulmonary Disease

## 2020-04-05 DIAGNOSIS — U071 COVID-19: Secondary | ICD-10-CM | POA: Diagnosis not present

## 2020-04-05 DIAGNOSIS — Z6837 Body mass index (BMI) 37.0-37.9, adult: Secondary | ICD-10-CM

## 2020-04-05 MED ORDER — ALBUTEROL SULFATE HFA 108 (90 BASE) MCG/ACT IN AERS: 2.0000 | INHALATION_SPRAY | Freq: Once | RESPIRATORY_TRACT | Status: DC | PRN

## 2020-04-05 MED ORDER — EPINEPHRINE 0.3 MG/0.3ML IJ SOAJ: 0.3000 mg | Freq: Once | INTRAMUSCULAR | Status: DC | PRN

## 2020-04-05 MED ORDER — FAMOTIDINE IN NACL 20-0.9 MG/50ML-% IV SOLN: 20.0000 mg | Freq: Once | INTRAVENOUS | Status: DC | PRN

## 2020-04-05 MED ORDER — METHYLPREDNISOLONE SODIUM SUCC 125 MG IJ SOLR: 125.0000 mg | Freq: Once | INTRAMUSCULAR | Status: DC | PRN

## 2020-04-05 MED ORDER — SODIUM CHLORIDE 0.9 % IV SOLN
1200.0000 mg | Freq: Once | INTRAVENOUS | Status: AC
  Administered 2020-04-05: 1200 mg via INTRAVENOUS
  Filled 2020-04-05: qty 10

## 2020-04-05 MED ORDER — SODIUM CHLORIDE 0.9 % IV SOLN: INTRAVENOUS | Status: DC | PRN

## 2020-04-05 MED ORDER — DIPHENHYDRAMINE HCL 50 MG/ML IJ SOLN: 50.0000 mg | Freq: Once | INTRAMUSCULAR | Status: DC | PRN

## 2020-04-05 NOTE — Progress Notes (Signed)
  Diagnosis: COVID-19  Physician: Wright, MD  Procedure: Cov[facility] Infusion Clinic Med: casirivimab\imdevimab infusion - Provided patient with casirivimab\imdevimab fact sheet for patients, parents and caregivers prior to infusion.  Complications: No immediate complications noted.  Discharge: Discharged home   Marquesha Robideau R Madiline Saffran 04/05/2020   

## 2020-04-05 NOTE — Progress Notes (Signed)
  Diagnosis: COVID-19  Physician:Dr Wright   Procedure: Covid Infusion Clinic Med: casirivimab\imdevimab infusion - Provided patient with casirivimab\imdevimab fact sheet for patients, parents and caregivers prior to infusion.  Complications: No immediate complications noted.  Discharge: Discharged home   Jaslen Adcox W 04/05/2020  

## 2020-04-05 NOTE — Discharge Instructions (Signed)

## 2020-10-18 IMAGING — CR DG CHEST 2V
2 series · 2 of 2 positions shown · non-contrast
Comparison: 03/16/2017

CLINICAL DATA: Asthma exacerbation

EXAM:
CHEST - 2 VIEW

[chest pa]
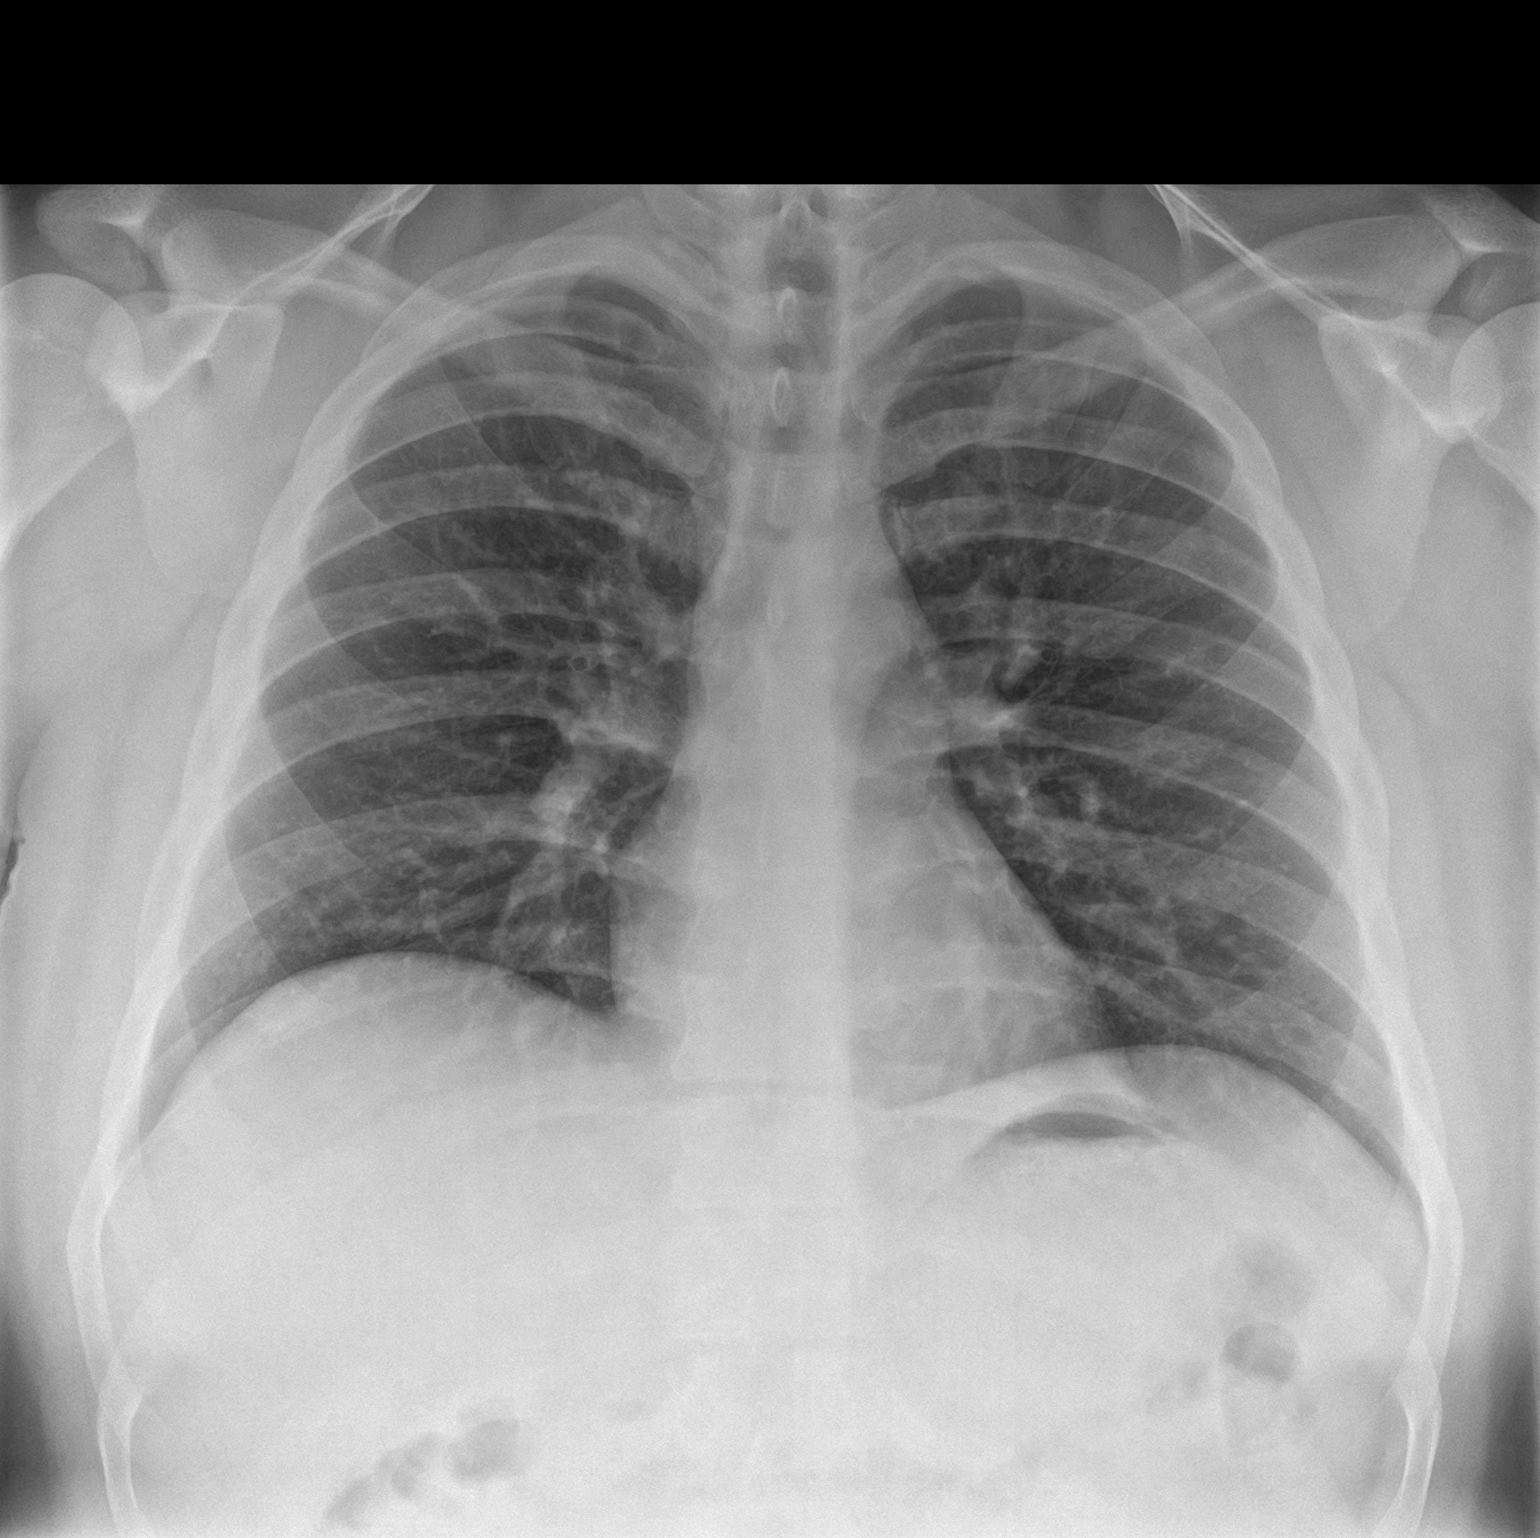

[chest lat]
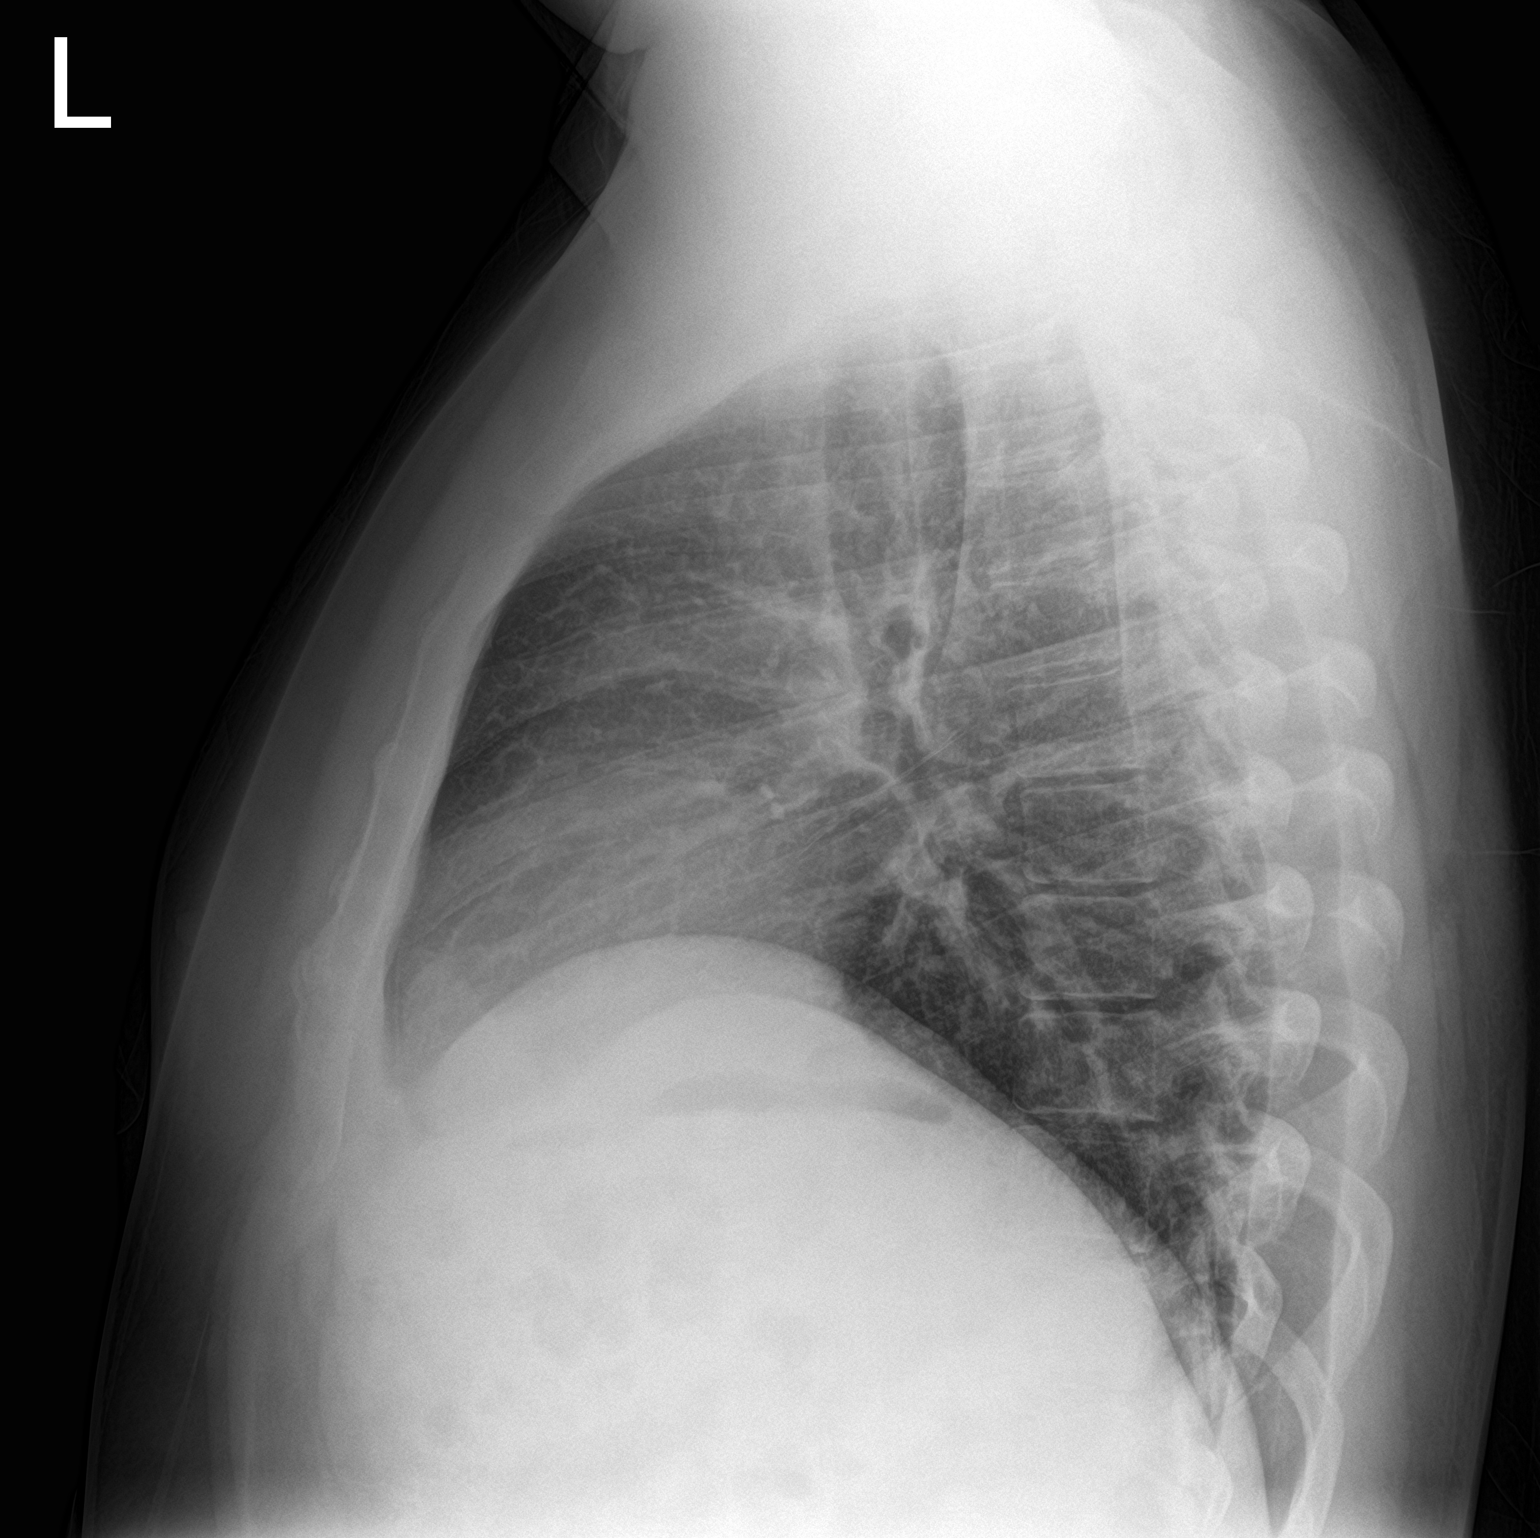

[2 of 2 positions shown; findings below may reference images not displayed]

FINDINGS: The heart size and mediastinal contours are within normal limits.
Both lungs are clear. The visualized skeletal structures are
unremarkable.
IMPRESSION: No acute abnormality of the lungs.

## 2021-04-04 IMAGING — CR DG CHEST 2V
1 series · 2 of 2 positions shown · non-contrast
Comparison: Chest x-ray 06/16/2019.

CLINICAL DATA: 23-year-old male with history of anterior chest pain
for the past 3 days.

EXAM:
CHEST - 2 VIEW

[Series 1: dg chest 2 view · 0.14mm/px · 2 of 2 slices shown]
[im 1/2]
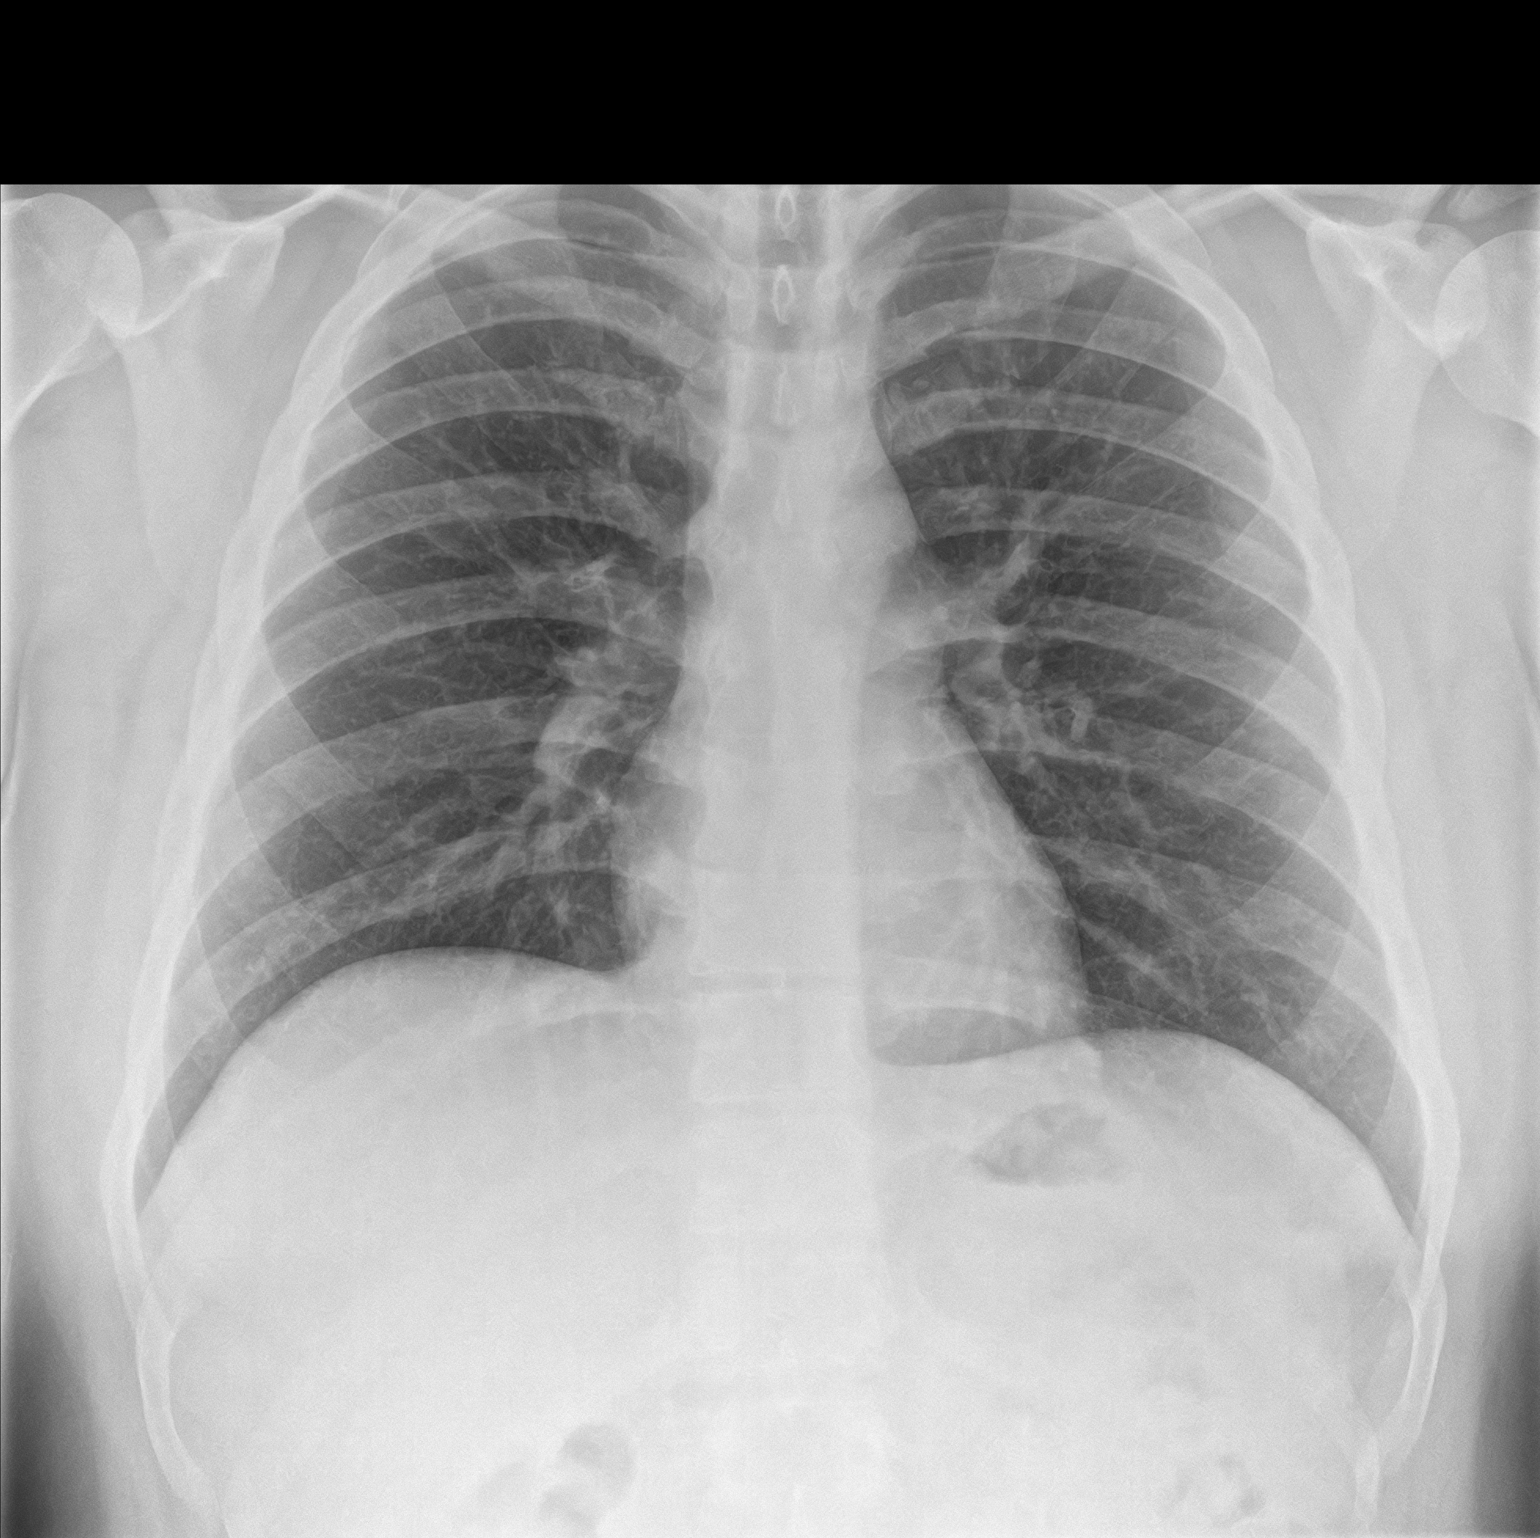
[im 2/2]
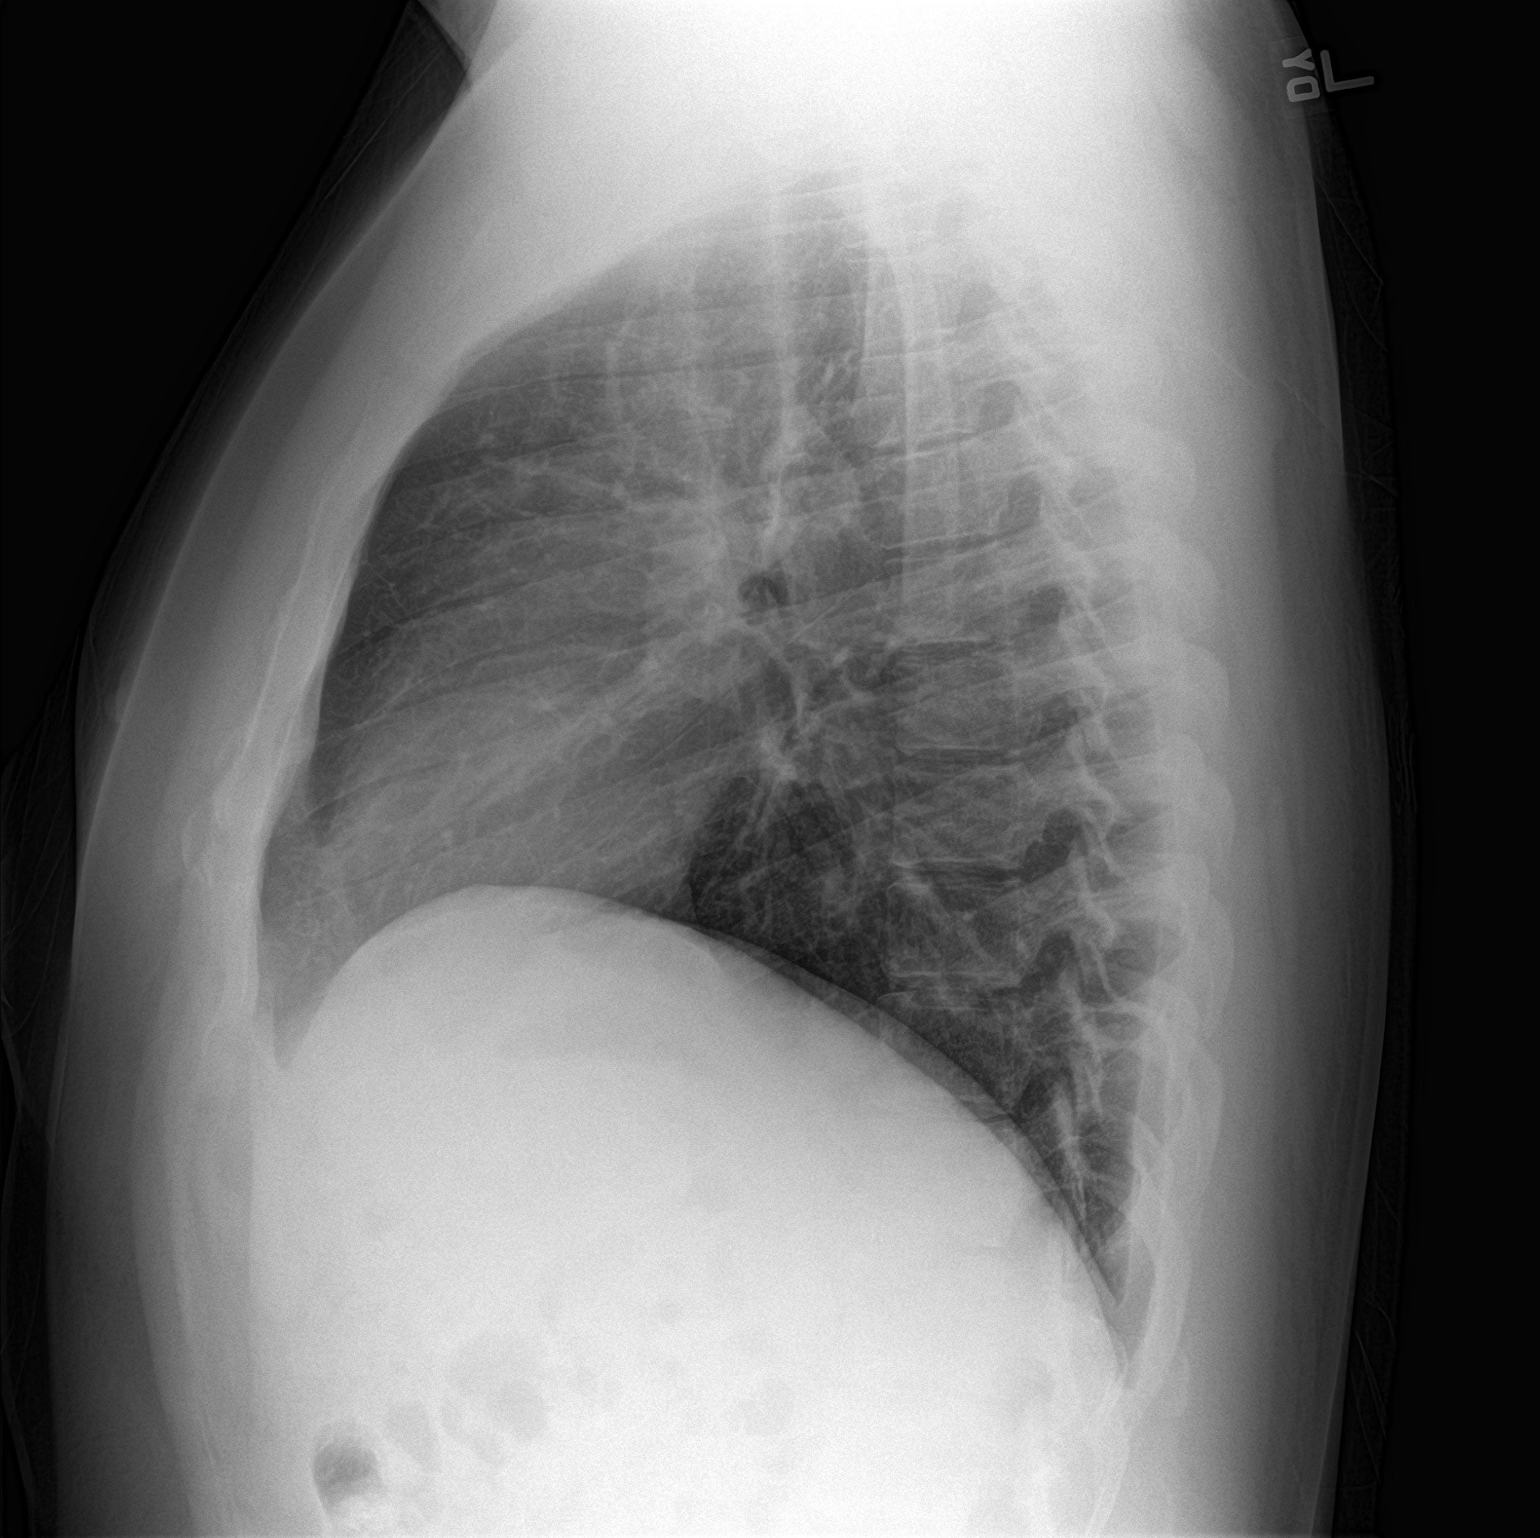

[2 of 2 positions shown; findings below may reference images not displayed]

FINDINGS: Lung volumes are normal. No consolidative airspace disease. No
pleural effusions. No pneumothorax. No pulmonary nodule or mass
noted. Pulmonary vasculature and the cardiomediastinal silhouette
are within normal limits.
IMPRESSION: No radiographic evidence of acute cardiopulmonary disease.

## 2022-09-17 ENCOUNTER — Emergency Department
Admission: EM | Admit: 2022-09-17 | Discharge: 2022-09-17 | Disposition: A | Discharge status: Home / Self Care | Payer: Self-pay | Attending: Student in an Organized Health Care Education/Training Program | Admitting: Student in an Organized Health Care Education/Training Program

## 2022-09-17 ENCOUNTER — Other Ambulatory Visit: Payer: Self-pay

## 2022-09-17 DIAGNOSIS — J111 Influenza due to unidentified influenza virus with other respiratory manifestations: Secondary | ICD-10-CM

## 2022-09-17 DIAGNOSIS — J45909 Unspecified asthma, uncomplicated: Secondary | ICD-10-CM | POA: Insufficient documentation

## 2022-09-17 DIAGNOSIS — Z20822 Contact with and (suspected) exposure to covid-19: Secondary | ICD-10-CM | POA: Insufficient documentation

## 2022-09-17 DIAGNOSIS — J101 Influenza due to other identified influenza virus with other respiratory manifestations: Secondary | ICD-10-CM | POA: Insufficient documentation

## 2022-09-17 LAB — RESP PANEL BY RT-PCR (RSV, FLU A&B, COVID)  RVPGX2
Influenza A by PCR: NEGATIVE
Influenza B by PCR: POSITIVE — AB
Resp Syncytial Virus by PCR: NEGATIVE
SARS Coronavirus 2 by RT PCR: NEGATIVE

## 2022-09-17 NOTE — ED Provider Notes (Signed)
[facility] Emergency Department Provider Note     Event Date/Time   First MD Initiated Contact with Patient 09/17/22 1854     (approximate)   History   Generalized Body Aches (Patient reports congestion, cough, and body aches x 4 days)   HPI  Joe Sampson is a 27 y.o. male with a history of asthma, anxiety, depression, presents to the ED with 4 days of intermittent cough, congestion, body aches.  He reports similar symptoms and his sister and family members.   Physical Exam   Triage Vital Signs: ED Triage Vitals  Enc Vitals Group     BP 09/17/22 1745 133/82     Pulse Rate 09/17/22 1745 89     Resp 09/17/22 1745 19     Temp 09/17/22 1745 98.8 F (37.1 C)     Temp Source 09/17/22 1745 Oral     SpO2 09/17/22 1745 100 %     Weight 09/17/22 1743 270 lb (122.5 kg)     Height 09/17/22 1743 5' 10.5" (1.791 m)     Head Circumference --      Peak Flow --      Pain Score 09/17/22 1742 0     Pain Loc --      Pain Edu? --      Excl. in [place]? --     Most recent vital signs: Vitals:   09/17/22 1745  BP: 133/82  Pulse: 89  Resp: 19  Temp: 98.8 F (37.1 C)  SpO2: 100%    General Awake, no distress. NAD HEENT NCAT. PERRL. EOMI. No rhinorrhea. Mucous membranes are moist.  CV:  Good peripheral perfusion.  RESP:  Normal effort.  ABD:  No distention.    ED Results / Procedures / Treatments   Labs (all labs ordered are listed, but only abnormal results are displayed) Labs Reviewed  RESP PANEL BY RT-PCR (RSV, FLU A&B, COVID)  [id number] - Abnormal; Notable for the following components:      Result Value   Influenza B by PCR POSITIVE (*)    All other components within normal limits     EKG   RADIOLOGY  No results found.   PROCEDURES:  Critical Care performed: No  Procedures   MEDICATIONS ORDERED IN ED: Medications - No data to display   IMPRESSION / MDM / [place] / ED COURSE  I reviewed the triage vital signs and  the nursing notes.                              Differential diagnosis includes, but is not limited to, COVID, flu, RSV, AOM, viral URI  Patient's presentation is most consistent with acute complicated illness / injury requiring diagnostic workup.  Patient to the ED for evaluation of cold symptoms with sick contacts at home.  Patient's diagnosis is consistent with influenza a confirmed by viral panel PCR. Patient will be discharged home with instructions to take OTC cough and cold medicine for symptom relief as necessary. Patient is to follow up with his primary provider as needed or otherwise directed. Patient is given ED precautions to return to the ED for any worsening or new symptoms.     FINAL CLINICAL IMPRESSION(S) / ED DIAGNOSES   Final diagnoses:  Influenza     Rx / DC Orders   ED Discharge Orders     None        Note:  This document was prepared using Dragon voice recognition software and may include unintentional dictation errors.    Melvenia Needles, PA-C 09/17/22 1914    Merlyn Lot, MD 09/17/22 1929

## 2022-09-17 NOTE — Discharge Instructions (Addendum)
Your viral panel test confirms influenza.  Take the OTC DayQuil NyQuil as prescribed.  You may add OTC nasal steroid spray (Flonase, Nasonex, Nasocort) for sinus relief.  Consider OTC pseudoephedrine for sinus pressure relief.  Follow-up with primary provider or return to the ED if needed.

## 2022-09-17 NOTE — ED Triage Notes (Signed)
Patient reports congestion, cough, and body aches x 4 days
